# Patient Record
Sex: Female | Born: 1985
Health system: Southern US, Community
[De-identification: ages and names within clinical notes are randomized; demographics above are authoritative.]

## PROBLEM LIST (undated history)

## (undated) DIAGNOSIS — F32A Depression, unspecified: Secondary | ICD-10-CM

## (undated) DIAGNOSIS — F329 Major depressive disorder, single episode, unspecified: Secondary | ICD-10-CM

## (undated) DIAGNOSIS — M1990 Unspecified osteoarthritis, unspecified site: Secondary | ICD-10-CM

## (undated) HISTORY — DX: Major depressive disorder, single episode, unspecified: F32.9

## (undated) HISTORY — DX: Unspecified osteoarthritis, unspecified site: M19.90

## (undated) HISTORY — DX: Depression, unspecified: F32.A

---
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
## 2009-07-02 HISTORY — PX: OTHER SURGICAL HISTORY: SHX169

## 2009-08-28 ENCOUNTER — Emergency Department (HOSPITAL_COMMUNITY): Admission: EM | Admit: 2009-08-28 | Discharge: 2009-08-28 | Payer: Self-pay | Admitting: Emergency Medicine

## 2010-09-21 LAB — URINALYSIS, ROUTINE W REFLEX MICROSCOPIC
Bilirubin Urine: NEGATIVE
Ketones, ur: 15 mg/dL — AB
Specific Gravity, Urine: 1.024 (ref 1.005–1.030)
Urobilinogen, UA: 0.2 mg/dL (ref 0.0–1.0)
pH: 8.5 — ABNORMAL HIGH (ref 5.0–8.0)

## 2010-09-21 LAB — URINE MICROSCOPIC-ADD ON

## 2010-09-21 LAB — URINE CULTURE: Colony Count: NO GROWTH

## 2010-09-21 LAB — POCT I-STAT, CHEM 8
BUN: 11 mg/dL (ref 6–23)
Calcium, Ion: 1.12 mmol/L (ref 1.12–1.32)
Chloride: 108 mEq/L (ref 96–112)
Glucose, Bld: 95 mg/dL (ref 70–99)

## 2011-09-10 ENCOUNTER — Encounter: Payer: Self-pay | Admitting: Family Medicine

## 2011-09-10 ENCOUNTER — Ambulatory Visit (INDEPENDENT_AMBULATORY_CARE_PROVIDER_SITE_OTHER): Payer: BC Managed Care – PPO | Admitting: Family Medicine

## 2011-09-10 VITALS — BP 112/68 | HR 72 | Temp 98.0°F | Resp 12 | Ht 61.0 in | Wt 108.0 lb

## 2011-09-10 DIAGNOSIS — F419 Anxiety disorder, unspecified: Secondary | ICD-10-CM

## 2011-09-10 DIAGNOSIS — D239 Other benign neoplasm of skin, unspecified: Secondary | ICD-10-CM

## 2011-09-10 DIAGNOSIS — D229 Melanocytic nevi, unspecified: Secondary | ICD-10-CM

## 2011-09-10 DIAGNOSIS — R300 Dysuria: Secondary | ICD-10-CM

## 2011-09-10 DIAGNOSIS — F411 Generalized anxiety disorder: Secondary | ICD-10-CM

## 2011-09-10 DIAGNOSIS — Z8659 Personal history of other mental and behavioral disorders: Secondary | ICD-10-CM

## 2011-09-10 MED ORDER — CLONAZEPAM 0.5 MG PO TABS: 0.5000 mg | ORAL_TABLET | ORAL | Status: DC | PRN

## 2011-09-10 NOTE — Progress Notes (Signed)
  Subjective:    Patient ID: Sherry Mccoy, female    DOB: December 28, 1985, 26 y.o.   MRN: [id number]  HPI  New patient to establish care. She has history of intermittent anxiety which sounds like situational anxiety. She has taken clonazepam in the past as needed but takes rarely. She had some type of jaw problem last year which sound like TMJ. Past history depression which is currently stable off medication. She receives regular counseling.  Sees a gynecologist and has IUD.  Wisdom teeth extraction 2009 but no other surgeries. Occasional alcohol use. Nonsmoker. She is engaged to get married later this year.  She's had several moles and requests skin check to assess. Grandparent with some type of skin cancer but no known family history of melanoma.  Past Medical History  Diagnosis Date  . Arthritis     jaw  . Depression    Past Surgical History  Procedure Date  . Abd pain 2011    reports that she has never smoked. She does not have any smokeless tobacco history on file. Her alcohol and drug histories not on file. family history includes Alcohol abuse in her father and mother; Cancer in her maternal grandmother; and Diabetes in her paternal grandfather. No Known Allergies    Review of Systems  Constitutional: Negative for fever, activity change, appetite change, fatigue and unexpected weight change.  HENT: Negative for hearing loss, ear pain, sore throat and trouble swallowing.   Eyes: Negative for visual disturbance.  Respiratory: Negative for cough and shortness of breath.   Cardiovascular: Negative for chest pain and palpitations.  Gastrointestinal: Negative for abdominal pain, diarrhea, constipation and blood in stool.  Genitourinary: Negative for dysuria and hematuria.  Musculoskeletal: Negative for myalgias, back pain and arthralgias.  Skin: Negative for rash.  Neurological: Negative for dizziness, syncope and headaches.  Hematological: Negative for adenopathy.    Psychiatric/Behavioral: Negative for confusion and dysphoric mood.       Objective:   Physical Exam  Constitutional: She appears well-developed and well-nourished.  HENT:  Right Ear: External ear normal.  Left Ear: External ear normal.  Mouth/Throat: Oropharynx is clear and moist.  Neck: Neck supple. No thyromegaly present.  Cardiovascular: Normal rate and regular rhythm.   No murmur heard. Pulmonary/Chest: Effort normal and breath sounds normal. No respiratory distress. She has no wheezes. She has no rales.  Abdominal: Soft. She exhibits no mass. There is no tenderness.  Musculoskeletal: She exhibits no edema.  Lymphadenopathy:    She has no cervical adenopathy.  Skin:       Scattered nevi but none with significant atypical features.          Assessment & Plan:  #1 anxiety-likely situational by history.  Refilled clonazepam for prn use #2 Hx of depression stable. #3 benign nevi

## 2011-09-10 NOTE — Patient Instructions (Signed)
Watch for any changing moles and remember ABCD: A=asymmetry B=border irregularity C=color changes D=diameter > 6 mmm

## 2011-11-07 ENCOUNTER — Telehealth: Payer: Self-pay | Admitting: Family Medicine

## 2011-11-07 NOTE — Telephone Encounter (Signed)
This was a new pt on 09/10/1911

## 2011-11-07 NOTE — Telephone Encounter (Signed)
Refill OK

## 2011-11-07 NOTE — Telephone Encounter (Signed)
Pt never picked up script for clonazePAM (KLONOPIN) 0.5 MG tablet at pharmacy and now pharmacy says script has expired. Pt needs new script to be call in to [facility] on [address]. Pt would like to be notified when this has been called in to pharmacy.

## 2011-11-08 MED ORDER — CLONAZEPAM 0.5 MG PO TABS: 0.5000 mg | ORAL_TABLET | ORAL | Status: DC | PRN

## 2011-11-08 NOTE — Telephone Encounter (Signed)
Pt informed Rx called in on VM

## 2012-03-05 ENCOUNTER — Encounter: Payer: Self-pay | Admitting: Family Medicine

## 2012-03-05 ENCOUNTER — Ambulatory Visit (INDEPENDENT_AMBULATORY_CARE_PROVIDER_SITE_OTHER): Payer: BC Managed Care – PPO | Admitting: Family Medicine

## 2012-03-05 VITALS — BP 122/82 | Temp 98.2°F | Wt 112.5 lb

## 2012-03-05 DIAGNOSIS — H9190 Unspecified hearing loss, unspecified ear: Secondary | ICD-10-CM

## 2012-03-05 DIAGNOSIS — H9193 Unspecified hearing loss, bilateral: Secondary | ICD-10-CM

## 2012-03-05 NOTE — Progress Notes (Signed)
  Subjective:    Patient ID: Sherry Mccoy, female    DOB: 1985/11/17, 26 y.o.   MRN: [id number]  HPI  Patient describes possible bilateral hearing loss over the past 3-4 months. Denies vertigo. No recent nasal congestion. No ear drainage. No history of cerumen impaction. No headaches. No tinnitus. No history of loud noise exposure. Symptoms came on relatively gradual. No recent nausea or vomiting   Review of Systems  Constitutional: Negative for fever and chills.  HENT: Positive for hearing loss. Negative for ear pain, congestion and tinnitus.   Eyes: Negative for visual disturbance.  Neurological: Negative for dizziness and headaches.       Objective:   Physical Exam  Constitutional: She is oriented to person, place, and time. She appears well-developed and well-nourished.  HENT:  Right Ear: External ear normal.  Left Ear: External ear normal.  Mouth/Throat: Oropharynx is clear and moist.       Ear canals are patent. No cerumen. Eardrums appear normal. No evidence for effusion.  Neck: Neck supple.  Cardiovascular: Normal rate and regular rhythm.   Pulmonary/Chest: Effort normal and breath sounds normal.  Lymphadenopathy:    She has no cervical adenopathy.  Neurological: She is alert and oriented to person, place, and time. No cranial nerve deficit.       Weber and Rinne' screening tests are normal          Assessment & Plan:  Bilateral hearing loss subjectively. Check audiometry screen.  Audiometry  Reveals mild loss lower frequency range. No high frequency loss. No obvious source such as cerumen or effusion. ENT referral.

## 2012-03-13 ENCOUNTER — Encounter: Payer: Self-pay | Admitting: Family Medicine

## 2012-07-23 ENCOUNTER — Other Ambulatory Visit (INDEPENDENT_AMBULATORY_CARE_PROVIDER_SITE_OTHER): Payer: BC Managed Care – PPO

## 2012-07-23 DIAGNOSIS — Z0000 Encounter for general adult medical examination without abnormal findings: Secondary | ICD-10-CM

## 2012-07-23 LAB — LIPID PANEL
Cholesterol: 179 mg/dL (ref 0–200)
LDL Cholesterol: 112 mg/dL — ABNORMAL HIGH (ref 0–99)
Triglycerides: 61 mg/dL (ref 0.0–149.0)

## 2012-07-23 LAB — CBC WITH DIFFERENTIAL/PLATELET
Basophils Absolute: 0 10*3/uL (ref 0.0–0.1)
Eosinophils Absolute: 0.1 10*3/uL (ref 0.0–0.7)
Lymphocytes Relative: 41 % (ref 12.0–46.0)
MCHC: 33.9 g/dL (ref 30.0–36.0)
Neutro Abs: 1.7 10*3/uL (ref 1.4–7.7)
Neutrophils Relative %: 45.8 % (ref 43.0–77.0)
Platelets: 200 10*3/uL (ref 150.0–400.0)
RDW: 12.6 % (ref 11.5–14.6)

## 2012-07-23 LAB — BASIC METABOLIC PANEL
BUN: 12 mg/dL (ref 6–23)
CO2: 27 mEq/L (ref 19–32)
Calcium: 9.2 mg/dL (ref 8.4–10.5)
Creatinine, Ser: 0.7 mg/dL (ref 0.4–1.2)
Glucose, Bld: 82 mg/dL (ref 70–99)

## 2012-07-23 LAB — POCT URINALYSIS DIPSTICK
Glucose, UA: NEGATIVE
Nitrite, UA: NEGATIVE
Spec Grav, UA: 1.02
Urobilinogen, UA: 0.2

## 2012-07-23 LAB — HEPATIC FUNCTION PANEL
Albumin: 4.4 g/dL (ref 3.5–5.2)
Alkaline Phosphatase: 58 U/L (ref 39–117)
Bilirubin, Direct: 0.1 mg/dL (ref 0.0–0.3)

## 2012-07-30 ENCOUNTER — Encounter: Payer: Self-pay | Admitting: Family Medicine

## 2012-07-30 ENCOUNTER — Ambulatory Visit (INDEPENDENT_AMBULATORY_CARE_PROVIDER_SITE_OTHER): Payer: BC Managed Care – PPO | Admitting: Family Medicine

## 2012-07-30 VITALS — BP 110/70 | HR 72 | Temp 99.0°F | Resp 12 | Ht 62.0 in | Wt 112.0 lb

## 2012-07-30 DIAGNOSIS — Z0000 Encounter for general adult medical examination without abnormal findings: Secondary | ICD-10-CM

## 2012-07-30 NOTE — Progress Notes (Signed)
  Subjective:    Patient ID: Sherry Mccoy, female    DOB: May 15, 1986, 27 y.o.   MRN: [id number]  HPI  Patient seen for well visit. Generally very healthy. She sees gynecologist and has Mirena IUD. Nonsmoker. Exercises regularly. Tetanus up-to-date. No specific complaints. She has past history of depression which is currently stable off medication.  She has some pervasive fatigue for several years. Appetite and weight stable. Exercise regularly as above with no exercise intolerance. No dyspnea. No chest pains. Usually gets 10-11 hours sleep per night. No dietary changes.  Past Medical History  Diagnosis Date  . Arthritis     jaw  . Depression    Past Surgical History  Procedure Date  . Abd pain 2011    reports that she has never smoked. She does not have any smokeless tobacco history on file. Her alcohol and drug histories not on file. family history includes Alcohol abuse in her father and mother; Cancer in her maternal grandmother; and Diabetes in her paternal grandfather. No Known Allergies   Review of Systems  Constitutional: Negative for fever, activity change, appetite change, fatigue and unexpected weight change.  HENT: Negative for hearing loss, ear pain, sore throat and trouble swallowing.   Eyes: Negative for visual disturbance.  Respiratory: Negative for cough and shortness of breath.   Cardiovascular: Negative for chest pain and palpitations.  Gastrointestinal: Negative for abdominal pain, diarrhea, constipation and blood in stool.  Genitourinary: Negative for dysuria and hematuria.  Musculoskeletal: Negative for myalgias, back pain and arthralgias.  Skin: Negative for rash.  Neurological: Negative for dizziness, syncope and headaches.  Hematological: Negative for adenopathy.  Psychiatric/Behavioral: Negative for confusion and dysphoric mood.       Objective:   Physical Exam  Constitutional: She is oriented to person, place, and time. She appears well-developed  and well-nourished.  HENT:  Head: Normocephalic and atraumatic.  Eyes: EOM are normal. Pupils are equal, round, and reactive to light.  Neck: Normal range of motion. Neck supple. No thyromegaly present.  Cardiovascular: Normal rate, regular rhythm and normal heart sounds.   No murmur heard. Pulmonary/Chest: Breath sounds normal. No respiratory distress. She has no wheezes. She has no rales.  Abdominal: Soft. Bowel sounds are normal. She exhibits no distension and no mass. There is no tenderness. There is no rebound and no guarding.  Musculoskeletal: Normal range of motion. She exhibits no edema.  Lymphadenopathy:    She has no cervical adenopathy.  Neurological: She is alert and oriented to person, place, and time. She displays normal reflexes. No cranial nerve deficit.  Skin: No rash noted.  Psychiatric: She has a normal mood and affect. Her behavior is normal. Judgment and thought content normal.          Assessment & Plan:  Healthy 27 her old female. Labs reviewed with patient and no specific concerns. Continue regular exercise habits. She'll continue GYN followup.

## 2013-04-15 ENCOUNTER — Other Ambulatory Visit: Payer: Self-pay | Admitting: Family Medicine

## 2013-04-15 NOTE — Telephone Encounter (Signed)
Last visit 07/30/12 Last refill 11/08/11 #60 0 refill

## 2013-04-15 NOTE — Telephone Encounter (Signed)
Refill once 

## 2013-04-22 ENCOUNTER — Ambulatory Visit (INDEPENDENT_AMBULATORY_CARE_PROVIDER_SITE_OTHER): Payer: BC Managed Care – PPO | Admitting: Internal Medicine

## 2013-04-22 ENCOUNTER — Encounter: Payer: Self-pay | Admitting: Internal Medicine

## 2013-04-22 VITALS — BP 102/78 | HR 86 | Temp 98.4°F

## 2013-04-22 DIAGNOSIS — J029 Acute pharyngitis, unspecified: Secondary | ICD-10-CM

## 2013-04-22 DIAGNOSIS — F329 Major depressive disorder, single episode, unspecified: Secondary | ICD-10-CM

## 2013-04-22 DIAGNOSIS — F411 Generalized anxiety disorder: Secondary | ICD-10-CM

## 2013-04-22 DIAGNOSIS — F419 Anxiety disorder, unspecified: Secondary | ICD-10-CM

## 2013-04-22 MED ORDER — ESCITALOPRAM OXALATE 10 MG PO TABS: 10.0000 mg | ORAL_TABLET | Freq: Every day | ORAL | Status: DC

## 2013-04-22 MED ORDER — AZITHROMYCIN 250 MG PO TABS: ORAL_TABLET | ORAL | Status: DC

## 2013-04-22 NOTE — Progress Notes (Signed)
Subjective:    Patient ID: Sherry Mccoy, female    DOB: January 26, 1986, 27 y.o.   MRN: [id number]  Sore Throat  This is a new problem. The current episode started in the past 7 days. The problem has been gradually worsening. The pain is worse on the left side. The maximum temperature recorded prior to her arrival was 100 - 100.9 F. The pain is moderate. Associated symptoms include congestion and a hoarse voice. Pertinent negatives include no coughing, diarrhea, plugged ear sensation, neck pain, shortness of breath, swollen glands, trouble swallowing or vomiting. She has had no exposure to mono. Strep: ? She has tried cool liquids and gargles for the symptoms. The treatment provided mild relief.  Anxiety Presents for initial visit. Onset was 1 to 4 weeks ago. The problem has been waxing and waning. Symptoms include decreased concentration, depressed mood, irritability and nervous/anxious behavior. Patient reports no excessive worry, panic, shortness of breath or suicidal ideas. Symptoms occur most days. The severity of symptoms is moderate. Exacerbated by: social stressors - spoue has requested a "break" from her. The quality of sleep is poor.   Risk factors include marital problems. Her past medical history is significant for anxiety/panic attacks and depression. There is no history of suicide attempts. Past treatments include counseling (CBT) and benzodiazephines (previosuly on lexapro, but not in past 3 years). The treatment provided mild relief. Compliance with prior treatments has been good.   Past Medical History  Diagnosis Date  . Arthritis     jaw  . Depression     Review of Systems  Constitutional: Positive for irritability.  HENT: Positive for congestion and hoarse voice. Negative for trouble swallowing.   Respiratory: Negative for cough and shortness of breath.   Gastrointestinal: Negative for vomiting and diarrhea.  Musculoskeletal: Negative for neck pain.  Psychiatric/Behavioral:  Positive for decreased concentration. Negative for suicidal ideas. The patient is nervous/anxious.        Objective:   Physical Exam  BP 102/78  Pulse 86  Temp(Src) 98.4 F (36.9 C) (Oral)  SpO2 97% Wt Readings from Last 3 Encounters:  07/30/12 112 lb (50.803 kg)  03/05/12 112 lb 8 oz (51.03 kg)  09/10/11 108 lb (48.988 kg)   Constitutional: She appears well-developed and well-nourished. No distress.  HENT: Head: Normocephalic and atraumatic. Ears: B TMs ok, no erythema or effusion; Nose: Nose normal. Mouth/Throat: Oropharynx is erythematous, no patches of oropharyngeal exudate.  Eyes: Conjunctivae and EOM are normal. Pupils are equal, round, and reactive to light. No scleral icterus.  Neck: Normal range of motion. Neck supple. No JVD present. No thyromegaly present.  Cardiovascular: Normal rate, regular rhythm and normal heart sounds.  No murmur heard. No BLE edema. Pulmonary/Chest: Effort normal and breath sounds normal. No respiratory distress. She has no wheezes.  Psychiatric: She has a reserved, dysphoric and mildly anxious mood and affect. Her behavior is normal. Judgment and thought content normal.   Lab Results  Component Value Date   WBC 3.7* 07/23/2012   HGB 14.7 07/23/2012   HCT 43.3 07/23/2012   PLT 200.0 07/23/2012   GLUCOSE 82 07/23/2012   CHOL 179 07/23/2012   TRIG 61.0 07/23/2012   HDL 55.20 07/23/2012   LDLCALC 112* 07/23/2012   ALT 14 07/23/2012   AST 22 07/23/2012   NA 139 07/23/2012   K 4.4 07/23/2012   CL 105 07/23/2012   CREATININE 0.7 07/23/2012   BUN 12 07/23/2012   CO2 27 07/23/2012   TSH 2.03 07/23/2012  Assessment & Plan:   Acute pharyngitis - concern for strep by hx Empiric antibiotics and symptomatic care

## 2013-04-22 NOTE — Assessment & Plan Note (Signed)
History of same, currently situational precipitated by marital stress Previously on Lexapro, we'll resume same now In counseling, encouraged to increase frequency of visits Continue clonazepam as needed as ongoing Support offered we reviewed potential risk/benefit and possible side effects - pt understands and agrees to same  Patient to followup with primary care provider in 4-6 weeks for titration and review of symptoms/medication, patient agrees to call sooner if needed

## 2013-04-22 NOTE — Patient Instructions (Signed)
It was good to see you today.  Zpak antibiotics and continue Nyquil  Will also restart Lexapro as discussed  Your prescription(s) have been submitted to your pharmacy. Please take as directed and contact our office if you believe you are having problem(s) with the medication(s).  Alternate between ibuprofen and tylenol for aches, pain and fever symptoms as discussed  Hydrate, rest and call if worse or unimproved  follow up with Dr Caryl Never in 4-6 weeks to review depression symptoms and medications, call sooner if problems

## 2013-04-23 ENCOUNTER — Telehealth: Payer: Self-pay | Admitting: *Deleted

## 2013-04-23 MED ORDER — AMOXICILLIN 500 MG PO CAPS: 500.0000 mg | ORAL_CAPSULE | Freq: Three times a day (TID) | ORAL | Status: DC

## 2013-04-23 NOTE — Telephone Encounter (Signed)
Stop Z-Pak Begin amoxicillin instead -erx done

## 2013-04-23 NOTE — Telephone Encounter (Signed)
Spoke with pt advised of MDs message 

## 2013-04-23 NOTE — Telephone Encounter (Signed)
Pt called states the Z-Pac caused nausea, vomiting, diarrhea.  Please advise

## 2013-06-16 ENCOUNTER — Encounter: Payer: Self-pay | Admitting: *Deleted

## 2013-06-17 ENCOUNTER — Encounter: Payer: Self-pay | Admitting: Family Medicine

## 2013-06-17 ENCOUNTER — Ambulatory Visit (INDEPENDENT_AMBULATORY_CARE_PROVIDER_SITE_OTHER): Payer: BC Managed Care – PPO | Admitting: Family Medicine

## 2013-06-17 VITALS — BP 108/80 | HR 89 | Temp 98.9°F | Wt 103.0 lb

## 2013-06-17 DIAGNOSIS — Z7184 Encounter for health counseling related to travel: Secondary | ICD-10-CM

## 2013-06-17 DIAGNOSIS — F411 Generalized anxiety disorder: Secondary | ICD-10-CM

## 2013-06-17 DIAGNOSIS — Z7189 Other specified counseling: Secondary | ICD-10-CM

## 2013-06-17 DIAGNOSIS — F419 Anxiety disorder, unspecified: Secondary | ICD-10-CM

## 2013-06-17 DIAGNOSIS — Z8659 Personal history of other mental and behavioral disorders: Secondary | ICD-10-CM

## 2013-06-17 MED ORDER — ESCITALOPRAM OXALATE 10 MG PO TABS: 10.0000 mg | ORAL_TABLET | Freq: Every day | ORAL | Status: DC

## 2013-06-17 NOTE — Progress Notes (Signed)
Pre visit review using our clinic review tool, if applicable. No additional management support is needed unless otherwise documented below in the visit note. 

## 2013-06-17 NOTE — Progress Notes (Signed)
   Subjective:    Patient ID: Sherry Mccoy, female    DOB: 08-03-1985, 27 y.o.   MRN: [id number]  HPI  Patient seen regarding recent stress and anxiety issues. She was placed back on Lexapro recently following some marital difficulties. Her husband and she are back together they're trying to work things out counseling. Patient feels that she is much improved on Lexapro 10 mg daily. Less anxiety. Mood has stabilized. She takes: Nasal time but very rarely for insomnia or severe anxiety symptoms.   She has upcoming travel to [place] in March. She has never had hepatitis B vaccine. She will need typhoid vaccination and malaria prevention. Also apparently will need yellow fever vaccination  Past Medical History  Diagnosis Date  . Arthritis     jaw  . Depression    Past Surgical History  Procedure Laterality Date  . Abd pain  2011    reports that she has never smoked. She does not have any smokeless tobacco history on file. Her alcohol and drug histories are not on file. family history includes Alcohol abuse in her father and mother; Cancer in her maternal grandmother; Diabetes in her paternal grandfather. No Known Allergies   Review of Systems  Constitutional: Negative for appetite change and unexpected weight change.  Psychiatric/Behavioral: Negative for dysphoric mood, decreased concentration and agitation. The patient is not nervous/anxious.        Objective:   Physical Exam  Constitutional: She appears well-developed and well-nourished.  Cardiovascular: Normal rate.   Pulmonary/Chest: Breath sounds normal. No respiratory distress. She has no wheezes. She has no rales.  Psychiatric: She has a normal mood and affect. Her behavior is normal.          Assessment & Plan:  #1 travel medicine consultation. Recommend she get yellow fever vaccine and will set up through health department. She will come back for physical in January or February. Addressed hepatitis A and typhoid  vaccination all with malaria prevention that time #2 situational stress/anxiety. Continue counseling. Continue Lexapro 10 mg daily with refills given

## 2013-06-23 ENCOUNTER — Other Ambulatory Visit: Payer: Self-pay | Admitting: Internal Medicine

## 2013-06-23 NOTE — Telephone Encounter (Signed)
Refill for 6 months. 

## 2013-07-30 ENCOUNTER — Other Ambulatory Visit (INDEPENDENT_AMBULATORY_CARE_PROVIDER_SITE_OTHER): Payer: BC Managed Care – PPO

## 2013-07-30 DIAGNOSIS — Z0000 Encounter for general adult medical examination without abnormal findings: Secondary | ICD-10-CM

## 2013-07-30 LAB — CBC WITH DIFFERENTIAL/PLATELET
BASOS ABS: 0 10*3/uL (ref 0.0–0.1)
BASOS PCT: 0.3 % (ref 0.0–3.0)
EOS PCT: 1.4 % (ref 0.0–5.0)
Eosinophils Absolute: 0.1 10*3/uL (ref 0.0–0.7)
HEMATOCRIT: 40.7 % (ref 36.0–46.0)
HEMOGLOBIN: 13.6 g/dL (ref 12.0–15.0)
LYMPHS ABS: 1.2 10*3/uL (ref 0.7–4.0)
LYMPHS PCT: 31.7 % (ref 12.0–46.0)
MCHC: 33.5 g/dL (ref 30.0–36.0)
MCV: 95.7 fl (ref 78.0–100.0)
MONOS PCT: 9.9 % (ref 3.0–12.0)
Monocytes Absolute: 0.4 10*3/uL (ref 0.1–1.0)
NEUTROS ABS: 2.1 10*3/uL (ref 1.4–7.7)
Neutrophils Relative %: 56.7 % (ref 43.0–77.0)
Platelets: 188 10*3/uL (ref 150.0–400.0)
RBC: 4.25 Mil/uL (ref 3.87–5.11)
RDW: 12.7 % (ref 11.5–14.6)
WBC: 3.7 10*3/uL — AB (ref 4.5–10.5)

## 2013-07-30 LAB — LIPID PANEL
CHOL/HDL RATIO: 2
Cholesterol: 153 mg/dL (ref 0–200)
HDL: 69.2 mg/dL (ref >39.00)
LDL CALC: 77 mg/dL (ref 0–99)
Triglycerides: 35 mg/dL (ref 0.0–149.0)
VLDL: 7 mg/dL (ref 0.0–40.0)

## 2013-07-30 LAB — POCT URINALYSIS DIPSTICK
Bilirubin, UA: NEGATIVE
Blood, UA: NEGATIVE
Glucose, UA: NEGATIVE
KETONES UA: NEGATIVE
LEUKOCYTES UA: NEGATIVE
Nitrite, UA: NEGATIVE
PH UA: 5.5
PROTEIN UA: NEGATIVE
Spec Grav, UA: 1.025
UROBILINOGEN UA: 0.2

## 2013-07-30 LAB — BASIC METABOLIC PANEL
BUN: 12 mg/dL (ref 6–23)
CALCIUM: 9.1 mg/dL (ref 8.4–10.5)
CO2: 25 mEq/L (ref 19–32)
Chloride: 107 mEq/L (ref 96–112)
Creatinine, Ser: 0.7 mg/dL (ref 0.4–1.2)
GFR: 105.07 mL/min (ref >60.00)
Glucose, Bld: 78 mg/dL (ref 70–99)
POTASSIUM: 4.2 meq/L (ref 3.5–5.1)
SODIUM: 139 meq/L (ref 135–145)

## 2013-07-30 LAB — HEPATIC FUNCTION PANEL
ALBUMIN: 4.1 g/dL (ref 3.5–5.2)
ALK PHOS: 61 U/L (ref 39–117)
ALT: 18 U/L (ref 0–35)
AST: 24 U/L (ref 0–37)
Bilirubin, Direct: 0 mg/dL (ref 0.0–0.3)
TOTAL PROTEIN: 6.8 g/dL (ref 6.0–8.3)
Total Bilirubin: 0.7 mg/dL (ref 0.3–1.2)

## 2013-07-30 LAB — TSH: TSH: 1.81 u[IU]/mL (ref 0.35–5.50)

## 2013-08-05 ENCOUNTER — Encounter: Payer: Self-pay | Admitting: Family Medicine

## 2013-08-05 ENCOUNTER — Ambulatory Visit (INDEPENDENT_AMBULATORY_CARE_PROVIDER_SITE_OTHER): Payer: BC Managed Care – PPO | Admitting: Family Medicine

## 2013-08-05 VITALS — BP 110/70 | HR 63 | Temp 98.3°F | Ht 62.0 in | Wt 105.0 lb

## 2013-08-05 DIAGNOSIS — Z0000 Encounter for general adult medical examination without abnormal findings: Secondary | ICD-10-CM

## 2013-08-05 DIAGNOSIS — Z23 Encounter for immunization: Secondary | ICD-10-CM

## 2013-08-05 DIAGNOSIS — Z7184 Encounter for health counseling related to travel: Secondary | ICD-10-CM

## 2013-08-05 DIAGNOSIS — Z7189 Other specified counseling: Secondary | ICD-10-CM

## 2013-08-05 MED ORDER — ATOVAQUONE-PROGUANIL HCL 250-100 MG PO TABS: ORAL_TABLET | ORAL | Status: DC

## 2013-08-05 MED ORDER — TYPHOID VACCINE PO CPDR: 1.0000 | DELAYED_RELEASE_CAPSULE | ORAL | Status: DC

## 2013-08-05 MED ORDER — CIPROFLOXACIN HCL 500 MG PO TABS: ORAL_TABLET | ORAL | Status: DC

## 2013-08-05 MED ORDER — TEMAZEPAM 15 MG PO CAPS
15.0000 mg | ORAL_CAPSULE | Freq: Every evening | ORAL | Status: DC | PRN
Start: 2013-08-05 — End: 2014-08-11

## 2013-08-05 NOTE — Progress Notes (Signed)
   Subjective:    Patient ID: Sherry Mccoy, female    DOB: 09/28/1985, 28 y.o.   MRN: [id number]  HPI Here for complete physical She sees gynecologist regularly and has IUD in place She's had some recent depression issues which are stable on Lexapro. She's had some marital issues her past year but has received counseling and things are improved at this time.  Upcoming travel to [place]. No history of hepatitis A. She also needs malaria and typhoid prevention. She plans to get yellow fever vaccine through health Department.  Past Medical History  Diagnosis Date  . Arthritis     jaw  . Depression    Past Surgical History  Procedure Laterality Date  . Abd pain  2011    reports that she has never smoked. She does not have any smokeless tobacco history on file. Her alcohol and drug histories are not on file. family history includes Alcohol abuse in her mother; Cancer in her maternal grandmother; Diabetes in her paternal grandfather. No Known Allergies    Review of Systems  Constitutional: Negative for fever, activity change, appetite change, fatigue and unexpected weight change.  HENT: Negative for ear pain, hearing loss, sore throat and trouble swallowing.   Eyes: Negative for visual disturbance.  Respiratory: Negative for cough and shortness of breath.   Cardiovascular: Negative for chest pain and palpitations.  Gastrointestinal: Negative for abdominal pain, diarrhea, constipation and blood in stool.  Endocrine: Negative for polydipsia and polyuria.  Genitourinary: Negative for dysuria and hematuria.  Musculoskeletal: Negative for arthralgias, back pain and myalgias.  Skin: Negative for rash.  Neurological: Negative for dizziness, syncope and headaches.  Hematological: Negative for adenopathy.  Psychiatric/Behavioral: Negative for confusion and dysphoric mood.       Objective:   Physical Exam  Constitutional: She is oriented to person, place, and time. She appears  well-developed and well-nourished.  HENT:  Head: Normocephalic and atraumatic.  Eyes: EOM are normal. Pupils are equal, round, and reactive to light.  Neck: Normal range of motion. Neck supple. No thyromegaly present.  Cardiovascular: Normal rate, regular rhythm and normal heart sounds.   No murmur heard. Pulmonary/Chest: Breath sounds normal. No respiratory distress. She has no wheezes. She has no rales.  Abdominal: Soft. Bowel sounds are normal. She exhibits no distension and no mass. There is no tenderness. There is no rebound and no guarding.  Genitourinary:  Per GYN  Musculoskeletal: Normal range of motion. She exhibits no edema.  Lymphadenopathy:    She has no cervical adenopathy.  Neurological: She is alert and oriented to person, place, and time. She displays normal reflexes. No cranial nerve deficit.  Skin: No rash noted.  Psychiatric: She has a normal mood and affect. Her behavior is normal. Judgment and thought content normal.          Assessment & Plan:  #1 complete physical. Labs reviewed and all excellent. She'll continue GYN followup. Tetanus booster given next year. #2 travel medicine consultation. Hepatitis A vaccine given. Malarone 250 mg for malaria prevention. Typhoid oral vaccine given. Cipro as needed for traveler's diarrhea. Short-term only use of temazepam as needed for sleep

## 2013-08-05 NOTE — Progress Notes (Signed)
Pre visit review using our clinic review tool, if applicable. No additional management support is needed unless otherwise documented below in the visit note. 

## 2013-08-05 NOTE — Patient Instructions (Signed)
Traveling, Food and Drink Risks Unclean or not pure (contaminated) food and drink are common sources for bringing infection into the body, especially the digestive system (gastroenteritis). This can cause nausea, stomach cramping, diarrhea (with or without visible blood) and/or vomiting. Some of the common infections that travelers can get are:  Escherichia coli infections (E. coli).  Shigellosis or bacillary dysentery.  Giardiasis.  Campylobacter jejuni infections.  Cryptosporidiosis.  Hepatitis A.  Amebiasis. Other less common infectious disease risks for travelers include:  Typhoid fever and other salmonelloses.  Cholera.  Viral infections caused by rotavirus and noroviruses.  Various protozoan and helminthic (worm-like) parasites. Many of the infectious diseases transmitted in food and water can also be acquired when solid body wastes, or feces, contaminate food (fecal-oral route). WHAT IS TRAVELERS' DIARRHEA? The most common travel health problem is travelers' diarrhea. Between 20% and 50% of international travelers develop diarrhea each year. It often occurs in the first week of travel. However, it may occur at any time while traveling, or after returning home. The world is divided into three grades of risk:  Low-risk: [place], [place], [place], [place], [place], countries in [place] and [place].  Intermediate-risk: [place], [place], some of the [place].  High-risk: Most of [place], [place], [place], [place], [place] and [place]. In high risk places, often many people do not have access to plumbing or outhouses. The amount of contaminated stool is high and more open to flies. Poor electricity capacity can cause blackouts. This affects refrigeration, which may cause unsafe food storage. Lack of water supplies may mean a lack of sinks for hand washing by restaurant staff. People at greater risk include young adults, and those with low  immune response. This includes people with HIV/AIDS, or those taking medicines to decrease immunity. Also, people with inflammatory-bowel disease or diabetes, and people taking stomach medicines that reduce acid level (H-2 blockers or antacids). The leading cause of infection is consuming food or water contaminated with feces. Poor hygiene practice in local restaurants is thought to be the largest cause of travelers' diarrhea. The bacteria or germ that most often causes this illness is E. coli. WHAT ARE THE SYMPTOMS OF TRAVELERS' DIARRHEA? The illness often begins suddenly. It causes an increase in frequency, volume, and weight of stool. An affected person often has 4 to 5 loose, watery bowel movements each day. Other symptoms include nausea, vomiting, diarrhea, stomach cramping, bloating, fever, urgency, and general ill feeling (malaise). Most cases are not serious and go away in 1-2 days without treatment. Travelers' diarrhea is rarely life-threatening. (90% of cases resolve in 1 week. 98% resolve in 1 month.) PREVENTING TRAVELERS' DIARRHEA Reduce your exposure to potentially not pure water. Water that is chlorinated, using minimum water treatment standards used in the [place] protects against viral and bacterial (germs) diseases. Chlorine treatment alone might not kill some enteric (gut) viruses. It may not kill all infection causing parasites. Where chlorinated tap water is not available, or where hygiene and sanitation are poor, only the following might be safe to drink:   Beverages made with boiled water (tea, coffee).  Canned or bottled carbonated drinks (carbonated bottled water, soft drinks).  Beer.  Wine. When buying carbonated drinks or bottled water, always inspect the bottle seal. Make sure it has not been opened. This could mean it was refilled with unclean beverages. If you suspect a bottle seal has been tampered with, return or discard it.  Where water might be contaminated, ice could  be, also. Ice should not be used in beverages. If ice comes in contact with containers used for drinking, discard the ice. Thoroughly clean the containers, preferably with soap and hot water.  It is safer to drink a beverage directly from the can or bottle than from a questionable container. However, water on the outside of cans or bottles might not be pure. Dry wet cans or bottles before they are opened. Wipe clean the surfaces where your mouth will have contact. Also, avoid brushing your teeth with tap water.  PREVENTIVE TREATMENT OF WATER  The following methods can be used to treat water. This makes it safe for drinking and other purposes.   Boiling. This is the best method. Bring water to a rolling boil for 1 minute. Then allow it to cool to room temperature. Ice should not be added. Boiling will kill bacterial and parasitic causes of diarrhea at all altitudes. It will kill viruses at low altitudes. To kill viruses at altitudes above 2,000 meters (6,562 feet), water should be boiled for 3 minutes. Or chemical disinfection should be used after the water has boiled for 1 minute. To improve taste, add a pinch of salt to each quart. Or pour the water several times from one clean container to another.  Chemical disinfection. Iodine can be used, when boiling is not possible. However, this method might not kill all parasites, unless the water sits for 15 hours before it is drunk. Two well-tested methods for disinfection with iodine are the use of:  Tincture of iodine.  Tetraglycine hydroperiodide tablets. Examples are Globaline, Potable-Aqua, or Coghlan's. You can find these tablets at pharmacies and sporting goods stores. Follow the instructions on the label. If water is cloudy, double the number of tablets used. If water is extremely cold (less than 5 Celsius or 41 Fahrenheit), try to warm it. Increase the advised contact time to achieve reliable disinfection. Cloudy water should be strained through  a clean cloth into a container. This should remove floating matter. Then the water should be boiled. Or it can be treated with iodine. Chlorine, in various forms, can also be used for chemical disinfection. But its germ killing activity varies greatly with the acidity (pH), temperature, and content of the water. Chemically treated water is meant for short-term use only. Use iodine disinfected water for only a few weeks.   Portable filters provide various degrees of protection. Reverse-osmosis filters protect against viruses, bacteria (germs), and protozoa. However, they are expensive and large. The small pores on this type of filter get quickly plugged by muddy or cloudy water. The membranes in some filters can be damaged by chlorine. Microstrainer filters (0.1- to 0.3-micrometers), can remove bacteria and protozoa. But they do not remove viruses. To kill viruses, travelers should disinfect the water with iodine or chlorine after filtration. Filters with iodine-impregnated resins work best against bacteria. The iodine will kill some viruses. But the contact time with the iodine in the filter is too short to kill some parasites. To produce safe water, proper selection, operation, care, and maintenance of water filters is needed. Follow the instructions on the label.  As a last resort, if safe drinking water is not available, very hot tap water might be safer than cold tap water. But proper disinfection, filtering, or boiling is still advised. REDUCING RISK FROM FOOD  Reduce your exposure to potentially contaminated food. To avoid illness, travelers should select food with care. All raw food could be contaminated. Especially where hygiene and sanitation are  poor, avoid:  Salads.  Uncooked vegetables.  Unpeeled fruits or vegetables.  Unpasteurized milk and milk products, such as cheese.  Undercooked and raw meat, fish, and shellfish.  Eat only food that has been cooked and is still hot.  Eat fruit  that has been prepared by a food service provider who routinely caters to foreign travelers.  Cooked food that stands for hours at room temperature can have bacterial growth. It should be thoroughly reheated before serving. Avoid foods that may have been sitting for some time in the kitchen or in a buffet.  Do not eat food purchased from street vendors. Consuming food and beverages purchased from street vendors has been linked to increased risk of illness.  Eat at restaurants that often cater to foreign travelers (leading hotels, hotel chains).  To guarantee safe food for an infant younger than 47 months of age, breastfeed. If the infant has already been weaned from the breast, formula prepared from commercial powder and boiled water is the safest food.  Carry small containers of hand-sanitizing solutions or gels (with at least 60% alcohol). This makes it easier to clean your hands before eating.  Some fish and shellfish contain poisonous biotoxins (such as ciguatoxin), even when cooked. Marya Landry is the most toxin filled. It should always be avoided. Red snapper, grouper, amber jack, sea bass, and other tropical reef fish contain the toxin at various times. Poisoning potential exists in all areas where those fish are eaten. Especially in [place] areas of the [place], [place], and [place]. Symptoms of this poisoning include gastroenteritis. That is followed by:  Neurologic problems, such as dysesthesias (impaired senses, especially touch).  Temperature reversal.  Weakness.  Rarely, low blood pressure (hypotension).  Scombroid is another common fish poisoning. It occurs worldwide in tropical and temperate regions. Fish of the [place] family (bluefin, yellow fin tuna, mackerel, bonito), and some non-scombroid fish (mahi [place], herring, amber [place], [place]) may contain high levels of histidine. With poor refrigeration or preservation, histidine turns into  histamine. This can cause:  Flushing (becoming red faced).  Headache.  Feeling sick to your stomach (nausea).  Vomiting.  Diarrhea.  Hives (urticaria). Cholera has occurred in people who ate crab brought back from [place]. Travelers should not bring perishable seafood with them when they return to the [place]' DIARRHEA  This illness often goes away without treatment. Oral rehydration (giving the body safe water and added packets of salt and sugar mixtures) can replace lost fluids and chemicals in the blood (electrolytes). This is especially important in children and people with longstanding (chronic) diseases. For severe fluid loss, the best replacement is with oral rehydration solutions (ORS), such as the WHO ORS solutions. These are widely available at stores and pharmacies in most developing countries.  When symptoms first occur, stop eating, and only drink clear liquids (only for adults) to help quiet the stomach. Travelers who develop 3 or more loose stools in an 8-hour period may benefit from antibiotic medicine. Especially if you also have nausea, vomiting, stomach cramps, fever, or blood in stools. Antibiotics are often given for 3-5 days. Some caregivers will prescribe antibiotics for people who are traveling to high risk places, to be used if and when symptoms begin. If diarrhea continues despite treatment, travelers should be evaluated by a caregiver and treated for possible parasitic infection. Control of frequent diarrhea is possible with over-the-counter medicines called anti-motility agents. These drugs reduce the amount of diarrhea by slowing  transit time in the stomach. This allows more time for food and water to be absorbed. It is thought that diarrhea may be a defense mechanism the body uses, to reduce the contact time between infection causing germs and the stomach. Anti-motility drugs decrease the duration of diarrhea. But they should never be used by  travelers with fever or bloody diarrhea. Such drugs can increase the severity of disease by slowing the removal of germs from the body. If excessively or not properly taken, anti-motility agents can cause serious illness on their own. Studies have found that people who follow the above eating rules still get ill sometimes. Antibiotic prevention before infection occurs is not advised for most travelers. Exceptions include those who have a high risk of serious health problems if treated after infection. However, studies have shown that the active ingredient in Pepto-Bismol (bismuth subsalicylate, BSS), taken regularly while traveling, can offer some protection against travelers' diarrhea. Ask your caregiver about this option before traveling, if desired. Do not use in children. This Information Courtesy of CDC. Document Released: 09/08/2002 Document Revised: 09/10/2011 Document Reviewed: 04/19/2009 [facility] Patient Information 2014 [place].

## 2014-01-22 ENCOUNTER — Ambulatory Visit (INDEPENDENT_AMBULATORY_CARE_PROVIDER_SITE_OTHER): Payer: BC Managed Care – PPO | Admitting: Family Medicine

## 2014-01-22 ENCOUNTER — Encounter: Payer: Self-pay | Admitting: Family Medicine

## 2014-01-22 VITALS — BP 110/68 | HR 71 | Temp 98.4°F | Wt 103.0 lb

## 2014-01-22 DIAGNOSIS — J029 Acute pharyngitis, unspecified: Secondary | ICD-10-CM

## 2014-01-22 LAB — POCT RAPID STREP A (OFFICE): RAPID STREP A SCREEN: NEGATIVE

## 2014-01-22 MED ORDER — AMOXICILLIN 875 MG PO TABS: 875.0000 mg | ORAL_TABLET | Freq: Two times a day (BID) | ORAL | Status: AC

## 2014-01-22 NOTE — Progress Notes (Signed)
   Subjective:    Patient ID: Sherry Mccoy, female    DOB: 03-19-1986, 28 y.o.   MRN: [id number]  Otalgia  Associated symptoms include a sore throat. Pertinent negatives include no coughing.   Acute visit. Sore throat and left ear ache. Onset 2 days ago. She's had some chills and sweats and suspected fever, though not taken. She has some body aches and intermittent headaches. No cough or nasal congestion. No sick contacts. No skin rash  Past Medical History  Diagnosis Date  . Arthritis     jaw  . Depression    Past Surgical History  Procedure Laterality Date  . Abd pain  2011    reports that she has never smoked. She does not have any smokeless tobacco history on file. Her alcohol and drug histories are not on file. family history includes Alcohol abuse in her mother; Cancer in her maternal grandmother; Diabetes in her paternal grandfather. No Known Allergies    Review of Systems  Constitutional: Positive for fever, chills and fatigue.  HENT: Positive for ear pain and sore throat. Negative for congestion and voice change.   Respiratory: Negative for cough.   Hematological: Positive for adenopathy.       Objective:   Physical Exam  Constitutional: She appears well-developed and well-nourished.  HENT:  Right Ear: External ear normal.  Left Ear: External ear normal.  Posterior pharynx erythema. No exudate. Erythema is greater left side than right. No soft palate asymmetry. No evidence for peritonsillar abscess  Neck: Neck supple.  Tender anterior cervical nodes bilaterally  Cardiovascular: Normal rate and regular rhythm.   Pulmonary/Chest: Effort normal and breath sounds normal. No respiratory distress. She has no wheezes. She has no rales.  Lymphadenopathy:    She has cervical adenopathy.  Skin: No rash noted.          Assessment & Plan:  Acute pharyngitis. Rule out strep. Check rapid strep.  Rapid strep negative. Clinical suspicion for strep still present (lack  of cough/nasal, increased nodes, appearance of pharynx, etc).  Discussed possible throat cx.  Start Amoxicillin 875 mg bid for  10 days.

## 2014-01-22 NOTE — Patient Instructions (Signed)

## 2014-01-22 NOTE — Progress Notes (Signed)
Pre visit review using our clinic review tool, if applicable. No additional management support is needed unless otherwise documented below in the visit note. 

## 2014-02-25 ENCOUNTER — Telehealth: Payer: Self-pay | Admitting: Family Medicine

## 2014-02-25 NOTE — Telephone Encounter (Signed)
Noted  

## 2014-02-25 NOTE — Telephone Encounter (Signed)
Patient Information:  Caller Name: Deema  Phone: [phone]  Patient: Sherry Mccoy, Sherry Mccoy  Gender: Female  DOB: 09-20-1985  Age: 28 Years  PCP: Carolann Littler [facility])  Pregnant: No  Office Follow Up:  Does the office need to follow up with this patient?: No  Instructions For The Office: N/A   Symptoms  Reason For Call & Symptoms: Pt reports 2 cranker sore on the upper lip.  Reviewed Health History In EMR: Yes  Reviewed Medications In EMR: Yes  Reviewed Allergies In EMR: Yes  Reviewed Surgeries / Procedures: Yes  Date of Onset of Symptoms: 02/21/2014 OB / GYN:  LMP: 02/21/2014  Guideline(s) Used:  Cold Sores - Fever Blisters of Lip  Disposition Per Guideline:   See Today or Tomorrow in Office  Reason For Disposition Reached:   Patient wants to be seen  Advice Given:  Call Back If:  You become worse  Patient Will Follow Care Advice:  YES  Appointment Scheduled:  02/26/2014 08:45:00 Appointment Scheduled Provider:  Maudie Mercury (TEXT 1st, after 20 mins can call), [facility])

## 2014-02-26 ENCOUNTER — Encounter: Payer: Self-pay | Admitting: Family Medicine

## 2014-02-26 NOTE — Progress Notes (Signed)
Error   This encounter was created in error - please disregard. 

## 2014-03-31 ENCOUNTER — Other Ambulatory Visit: Payer: Self-pay | Admitting: Family Medicine

## 2014-03-31 NOTE — Telephone Encounter (Signed)
Last visit 01/22/14 Last refill 08/05/13 #[phone] 0 refill

## 2014-03-31 NOTE — Telephone Encounter (Signed)
Refill once.  Avoid regular use. 

## 2014-08-06 ENCOUNTER — Other Ambulatory Visit (INDEPENDENT_AMBULATORY_CARE_PROVIDER_SITE_OTHER): Payer: BLUE CROSS/BLUE SHIELD

## 2014-08-06 DIAGNOSIS — Z0000 Encounter for general adult medical examination without abnormal findings: Secondary | ICD-10-CM

## 2014-08-06 LAB — CBC WITH DIFFERENTIAL/PLATELET
Basophils Absolute: 0 10*3/uL (ref 0.0–0.1)
Basophils Relative: 0.3 % (ref 0.0–3.0)
Eosinophils Absolute: 0.1 10*3/uL (ref 0.0–0.7)
Eosinophils Relative: 1.8 % (ref 0.0–5.0)
HCT: 40.4 % (ref 36.0–46.0)
HEMOGLOBIN: 14.1 g/dL (ref 12.0–15.0)
LYMPHS ABS: 1.2 10*3/uL (ref 0.7–4.0)
Lymphocytes Relative: 36.7 % (ref 12.0–46.0)
MCHC: 34.8 g/dL (ref 30.0–36.0)
MCV: 90.9 fl (ref 78.0–100.0)
Monocytes Absolute: 0.3 10*3/uL (ref 0.1–1.0)
Monocytes Relative: 9.5 % (ref 3.0–12.0)
Neutro Abs: 1.7 10*3/uL (ref 1.4–7.7)
Neutrophils Relative %: 51.7 % (ref 43.0–77.0)
PLATELETS: 222 10*3/uL (ref 150.0–400.0)
RBC: 4.44 Mil/uL (ref 3.87–5.11)
RDW: 12.3 % (ref 11.5–15.5)
WBC: 3.3 10*3/uL — AB (ref 4.0–10.5)

## 2014-08-06 LAB — HEPATIC FUNCTION PANEL
ALK PHOS: 56 U/L (ref 39–117)
ALT: 12 U/L (ref 0–35)
AST: 19 U/L (ref 0–37)
Albumin: 4.3 g/dL (ref 3.5–5.2)
BILIRUBIN DIRECT: 0.1 mg/dL (ref 0.0–0.3)
TOTAL PROTEIN: 7.2 g/dL (ref 6.0–8.3)
Total Bilirubin: 0.5 mg/dL (ref 0.2–1.2)

## 2014-08-06 LAB — LIPID PANEL
CHOL/HDL RATIO: 2
Cholesterol: 157 mg/dL (ref 0–200)
HDL: 66.7 mg/dL (ref >39.00)
LDL CALC: 80 mg/dL (ref 0–99)
NonHDL: 90.3
TRIGLYCERIDES: 54 mg/dL (ref 0.0–149.0)
VLDL: 10.8 mg/dL (ref 0.0–40.0)

## 2014-08-06 LAB — BASIC METABOLIC PANEL
BUN: 10 mg/dL (ref 6–23)
CALCIUM: 9.1 mg/dL (ref 8.4–10.5)
CO2: 28 mEq/L (ref 19–32)
Chloride: 106 mEq/L (ref 96–112)
Creatinine, Ser: 0.81 mg/dL (ref 0.40–1.20)
GFR: 89.57 mL/min (ref >60.00)
Glucose, Bld: 81 mg/dL (ref 70–99)
POTASSIUM: 4.9 meq/L (ref 3.5–5.1)
SODIUM: 139 meq/L (ref 135–145)

## 2014-08-06 LAB — TSH: TSH: 2.87 u[IU]/mL (ref 0.35–4.50)

## 2014-08-11 ENCOUNTER — Ambulatory Visit (INDEPENDENT_AMBULATORY_CARE_PROVIDER_SITE_OTHER): Payer: BLUE CROSS/BLUE SHIELD | Admitting: Family Medicine

## 2014-08-11 ENCOUNTER — Encounter: Payer: Self-pay | Admitting: Family Medicine

## 2014-08-11 VITALS — BP 100/70 | HR 72 | Temp 97.9°F | Ht 62.0 in | Wt 104.0 lb

## 2014-08-11 DIAGNOSIS — Z0000 Encounter for general adult medical examination without abnormal findings: Secondary | ICD-10-CM

## 2014-08-11 DIAGNOSIS — Z23 Encounter for immunization: Secondary | ICD-10-CM

## 2014-08-11 DIAGNOSIS — D239 Other benign neoplasm of skin, unspecified: Secondary | ICD-10-CM

## 2014-08-11 DIAGNOSIS — D229 Melanocytic nevi, unspecified: Secondary | ICD-10-CM

## 2014-08-11 NOTE — Progress Notes (Signed)
Pre visit review using our clinic review tool, if applicable. No additional management support is needed unless otherwise documented below in the visit note. 

## 2014-08-11 NOTE — Progress Notes (Signed)
   Subjective:    Patient ID: Sherry Mccoy, female    DOB: August 19, 1985, 29 y.o.   MRN: [id number]  HPI Patient seen for complete physical. She sees gynecologist and has IUD. She has previously had history of depression but is currently stable off medications. Last tetanus 10 years ago. She declines flu vaccine. She is getting Pap smears through GYN office. Generally she feels well. Nonsmoker. No consistent exercise.  Past Medical History  Diagnosis Date  . Arthritis     jaw  . Depression    Past Surgical History  Procedure Laterality Date  . Abd pain  2011    reports that she has never smoked. She does not have any smokeless tobacco history on file. Her alcohol and drug histories are not on file. family history includes Alcohol abuse in her mother; Cancer in her maternal grandmother; Diabetes in her paternal grandfather. No Known Allergies    Review of Systems  Constitutional: Negative for fever, activity change, appetite change, fatigue and unexpected weight change.  HENT: Negative for ear pain, hearing loss, sore throat and trouble swallowing.   Eyes: Negative for visual disturbance.  Respiratory: Negative for cough and shortness of breath.   Cardiovascular: Negative for chest pain and palpitations.  Gastrointestinal: Negative for abdominal pain, diarrhea, constipation and blood in stool.  Genitourinary: Negative for dysuria and hematuria.  Musculoskeletal: Negative for myalgias, back pain and arthralgias.  Skin: Negative for rash.  Neurological: Negative for dizziness, syncope and headaches.  Hematological: Negative for adenopathy.  Psychiatric/Behavioral: Negative for confusion and dysphoric mood.       Objective:   Physical Exam  Constitutional: She is oriented to person, place, and time. She appears well-developed and well-nourished.  HENT:  Head: Normocephalic and atraumatic.  Eyes: EOM are normal. Pupils are equal, round, and reactive to light.  Neck: Normal range  of motion. Neck supple. No thyromegaly present.  Cardiovascular: Normal rate, regular rhythm and normal heart sounds.   No murmur heard. Pulmonary/Chest: Breath sounds normal. No respiratory distress. She has no wheezes. She has no rales.  Abdominal: Soft. Bowel sounds are normal. She exhibits no distension and no mass. There is no tenderness. There is no rebound and no guarding.  Musculoskeletal: Normal range of motion. She exhibits no edema.  Lymphadenopathy:    She has no cervical adenopathy.  Neurological: She is alert and oriented to person, place, and time. She displays normal reflexes. No cranial nerve deficit.  Skin: No rash noted.  Dark colored nevus right anterior scalp. This is in the frontal region. She has some minor border irregularity. About 6 mm diameter. Mostly symmetric  Psychiatric: She has a normal mood and affect. Her behavior is normal. Judgment and thought content normal.          Assessment & Plan:  Complete physical. Labs reviewed with no major concerns. Tetanus booster given. Flu vaccine offered and patient declines. Set up dermatology assessment for atypical nevus right scalp.  We suggested more consistent exercise. Increase dietary calcium intake

## 2014-08-11 NOTE — Addendum Note (Signed)
Addended by: Marcina Millard on: 08/11/2014 03:47 PM   Modules accepted: Orders

## 2015-08-19 ENCOUNTER — Other Ambulatory Visit (INDEPENDENT_AMBULATORY_CARE_PROVIDER_SITE_OTHER): Payer: BLUE CROSS/BLUE SHIELD

## 2015-08-19 DIAGNOSIS — Z0000 Encounter for general adult medical examination without abnormal findings: Secondary | ICD-10-CM

## 2015-08-19 LAB — CBC WITH DIFFERENTIAL/PLATELET
BASOS ABS: 0 10*3/uL (ref 0.0–0.1)
Basophils Relative: 0.4 % (ref 0.0–3.0)
EOS ABS: 0.1 10*3/uL (ref 0.0–0.7)
Eosinophils Relative: 2 % (ref 0.0–5.0)
HCT: 42.1 % (ref 36.0–46.0)
Hemoglobin: 14.5 g/dL (ref 12.0–15.0)
Lymphocytes Relative: 28.5 % (ref 12.0–46.0)
Lymphs Abs: 1.3 10*3/uL (ref 0.7–4.0)
MCHC: 34.4 g/dL (ref 30.0–36.0)
MCV: 92.4 fl (ref 78.0–100.0)
MONO ABS: 0.4 10*3/uL (ref 0.1–1.0)
Monocytes Relative: 9.7 % (ref 3.0–12.0)
Neutro Abs: 2.7 10*3/uL (ref 1.4–7.7)
Neutrophils Relative %: 59.4 % (ref 43.0–77.0)
Platelets: 213 10*3/uL (ref 150.0–400.0)
RBC: 4.56 Mil/uL (ref 3.87–5.11)
RDW: 11.9 % (ref 11.5–15.5)
WBC: 4.6 10*3/uL (ref 4.0–10.5)

## 2015-08-19 LAB — BASIC METABOLIC PANEL
BUN: 11 mg/dL (ref 6–23)
CALCIUM: 9.4 mg/dL (ref 8.4–10.5)
CO2: 27 mEq/L (ref 19–32)
Chloride: 103 mEq/L (ref 96–112)
Creatinine, Ser: 0.74 mg/dL (ref 0.40–1.20)
GFR: 98.68 mL/min (ref >60.00)
Glucose, Bld: 80 mg/dL (ref 70–99)
Potassium: 4.9 mEq/L (ref 3.5–5.1)
Sodium: 136 mEq/L (ref 135–145)

## 2015-08-19 LAB — LIPID PANEL
CHOL/HDL RATIO: 2
Cholesterol: 157 mg/dL (ref 0–200)
HDL: 65.9 mg/dL (ref >39.00)
LDL CALC: 77 mg/dL (ref 0–99)
NONHDL: 91.5
TRIGLYCERIDES: 74 mg/dL (ref 0.0–149.0)
VLDL: 14.8 mg/dL (ref 0.0–40.0)

## 2015-08-19 LAB — HEPATIC FUNCTION PANEL
ALT: 12 U/L (ref 0–35)
AST: 18 U/L (ref 0–37)
Albumin: 4.5 g/dL (ref 3.5–5.2)
Alkaline Phosphatase: 55 U/L (ref 39–117)
BILIRUBIN DIRECT: 0.1 mg/dL (ref 0.0–0.3)
BILIRUBIN TOTAL: 0.7 mg/dL (ref 0.2–1.2)
Total Protein: 7.4 g/dL (ref 6.0–8.3)

## 2015-08-19 LAB — TSH: TSH: 3.06 u[IU]/mL (ref 0.35–4.50)

## 2015-08-30 ENCOUNTER — Ambulatory Visit (INDEPENDENT_AMBULATORY_CARE_PROVIDER_SITE_OTHER): Payer: BLUE CROSS/BLUE SHIELD | Admitting: Family Medicine

## 2015-08-30 ENCOUNTER — Encounter: Payer: Self-pay | Admitting: Family Medicine

## 2015-08-30 VITALS — BP 100/80 | HR 74 | Temp 98.2°F | Ht 61.25 in | Wt 105.6 lb

## 2015-08-30 DIAGNOSIS — Z0000 Encounter for general adult medical examination without abnormal findings: Secondary | ICD-10-CM | POA: Diagnosis not present

## 2015-08-30 MED ORDER — CLONAZEPAM 0.5 MG PO TABS: 0.5000 mg | ORAL_TABLET | Freq: Two times a day (BID) | ORAL | Status: DC | PRN

## 2015-08-30 NOTE — Progress Notes (Signed)
Pre visit review using our clinic review tool, if applicable. No additional management support is needed unless otherwise documented below in the visit note. 

## 2015-08-30 NOTE — Progress Notes (Signed)
   Subjective:    Patient ID: Sherry Mccoy, female    DOB: 1985-08-31, 30 y.o.   MRN: [id number]  HPI  Patient here for physical exam. Generally very healthy. She has Mirena IUD and has not seen gynecologist in quite some time. Last Pap smear reportedly 3 years ago. She'll be getting married this September and may consider getting pregnant after then. She plans to see GYN soon and consider getting out her IUD. Immunizations up-to-date. No consistent exercise. Nonsmoker. Taken very infrequent clonazepam in past for severe anxiety symptoms. She uses this rarely usually once or twice per month.  Past Medical History  Diagnosis Date  . Arthritis     jaw  . Depression    Past Surgical History  Procedure Laterality Date  . Abd pain  2011    reports that she has never smoked. She does not have any smokeless tobacco history on file. Her alcohol and drug histories are not on file. family history includes Alcohol abuse in her mother; Cancer in her maternal grandmother; Diabetes in her paternal grandfather. No Known Allergies]  Review of Systems  Constitutional: Negative for fever, activity change, appetite change, fatigue and unexpected weight change.  HENT: Negative for ear pain, hearing loss, sore throat and trouble swallowing.   Eyes: Negative for visual disturbance.  Respiratory: Negative for cough and shortness of breath.   Cardiovascular: Negative for chest pain and palpitations.  Gastrointestinal: Negative for abdominal pain, diarrhea, constipation and blood in stool.  Genitourinary: Negative for dysuria and hematuria.  Musculoskeletal: Negative for myalgias, back pain and arthralgias.  Skin: Negative for rash.  Neurological: Negative for dizziness, syncope and headaches.  Hematological: Negative for adenopathy.  Psychiatric/Behavioral: Negative for confusion and dysphoric mood.       Objective:   Physical Exam  Constitutional: She is oriented to person, place, and time. She  appears well-developed and well-nourished.  HENT:  Head: Normocephalic and atraumatic.  Eyes: EOM are normal. Pupils are equal, round, and reactive to light.  Neck: Normal range of motion. Neck supple. No thyromegaly present.  Cardiovascular: Normal rate, regular rhythm and normal heart sounds.   No murmur heard. Pulmonary/Chest: Breath sounds normal. No respiratory distress. She has no wheezes. She has no rales.  Abdominal: Soft. Bowel sounds are normal. She exhibits no distension and no mass. There is no tenderness. There is no rebound and no guarding.  Musculoskeletal: Normal range of motion. She exhibits no edema.  Lymphadenopathy:    She has no cervical adenopathy.  Neurological: She is alert and oriented to person, place, and time. She displays normal reflexes. No cranial nerve deficit.  Skin: No rash noted.  Psychiatric: She has a normal mood and affect. Her behavior is normal. Judgment and thought content normal.          Assessment & Plan:    Physical exam. Labs reviewed. No major concerns.Recommend regular weightbearing exercise. She will follow-up with GYN regarding repeat follow-up Pap smears  We did discuss the fact that if she is looking at getting pregnant in the coming year would not recommend any further clonazepam. She had been on SSRIs in the past but anxiety symptoms are very infrequent at this juncture

## 2015-10-19 DIAGNOSIS — F4323 Adjustment disorder with mixed anxiety and depressed mood: Secondary | ICD-10-CM | POA: Diagnosis not present

## 2015-12-16 DIAGNOSIS — Z30431 Encounter for routine checking of intrauterine contraceptive device: Secondary | ICD-10-CM | POA: Diagnosis not present

## 2015-12-16 DIAGNOSIS — Z6820 Body mass index (BMI) 20.0-20.9, adult: Secondary | ICD-10-CM | POA: Diagnosis not present

## 2015-12-16 DIAGNOSIS — Z01419 Encounter for gynecological examination (general) (routine) without abnormal findings: Secondary | ICD-10-CM | POA: Diagnosis not present

## 2015-12-16 DIAGNOSIS — Z130 Encounter for screening for diseases of the blood and blood-forming organs and certain disorders involving the immune mechanism: Secondary | ICD-10-CM | POA: Diagnosis not present

## 2015-12-16 DIAGNOSIS — Z124 Encounter for screening for malignant neoplasm of cervix: Secondary | ICD-10-CM | POA: Diagnosis not present

## 2015-12-16 DIAGNOSIS — Z1151 Encounter for screening for human papillomavirus (HPV): Secondary | ICD-10-CM | POA: Diagnosis not present

## 2015-12-16 DIAGNOSIS — Z1389 Encounter for screening for other disorder: Secondary | ICD-10-CM | POA: Diagnosis not present

## 2015-12-16 LAB — HM PAP SMEAR

## 2015-12-20 ENCOUNTER — Encounter: Payer: Self-pay | Admitting: Family Medicine

## 2015-12-20 ENCOUNTER — Other Ambulatory Visit: Payer: Self-pay

## 2016-01-05 DIAGNOSIS — Z3202 Encounter for pregnancy test, result negative: Secondary | ICD-10-CM | POA: Diagnosis not present

## 2016-01-05 DIAGNOSIS — T8332XA Displacement of intrauterine contraceptive device, initial encounter: Secondary | ICD-10-CM | POA: Diagnosis not present

## 2016-01-05 DIAGNOSIS — Z30432 Encounter for removal of intrauterine contraceptive device: Secondary | ICD-10-CM | POA: Diagnosis not present

## 2016-03-07 ENCOUNTER — Telehealth: Payer: Self-pay | Admitting: Family Medicine

## 2016-03-07 NOTE — Telephone Encounter (Signed)
Pt would like to know if you could prescribe Diamox and something for travelers diarrhea  Pharm:  [facility] [address]

## 2016-03-12 NOTE — Telephone Encounter (Signed)
Pt is leaving for [place] on Friday. Please advise on both medications. She says that she will be doing a lot of hiking.

## 2016-03-12 NOTE — Telephone Encounter (Signed)
Really need to discuss issues with Diamox- several factors go into whether/how to prescribe (degree of altitude, rate of ascent, duration, etc).  Would be ideal to set up appt, if possible so we can go over.

## 2016-03-13 NOTE — Telephone Encounter (Signed)
Can we get pt scheduled ASAP to discuss medications?

## 2016-03-13 NOTE — Telephone Encounter (Signed)
Pt is scheduled tomorrow at 7:45am.

## 2016-03-14 ENCOUNTER — Ambulatory Visit (INDEPENDENT_AMBULATORY_CARE_PROVIDER_SITE_OTHER): Payer: BLUE CROSS/BLUE SHIELD | Admitting: Family Medicine

## 2016-03-14 VITALS — BP 100/60 | HR 86 | Temp 98.1°F | Ht 61.25 in | Wt 104.0 lb

## 2016-03-14 DIAGNOSIS — Z7184 Encounter for health counseling related to travel: Secondary | ICD-10-CM

## 2016-03-14 DIAGNOSIS — Z7189 Other specified counseling: Secondary | ICD-10-CM | POA: Diagnosis not present

## 2016-03-14 MED ORDER — ACETAZOLAMIDE 125 MG PO TABS: ORAL_TABLET | ORAL | 0 refills | Status: DC

## 2016-03-14 MED ORDER — CIPROFLOXACIN HCL 500 MG PO TABS: ORAL_TABLET | ORAL | 0 refills | Status: DC

## 2016-03-14 NOTE — Patient Instructions (Signed)
Altitude Sickness Altitude sickness occurs when a person goes to a high altitude (at least 8,200 ft [2,460 m] above sea level) without first letting the body adjust to the higher altitude (acclimate).Symptoms generally develop within 72 hours of arriving at high altitude. It can happen to anyone, regardless of physical condition. Altitude sickness is a medical emergency that can develop into a life-threatening condition.  CAUSES  Altitude sickness is caused by rapidly ascending to higher altitudes that expose you to lower air pressure and lower oxygen levels. Going to high altitudes quickly and exerting yourself can make altitude sickness more likely to occur.  SYMPTOMS   Severe headache.  Nausea and vomiting.  Shortness of breath.  Dizziness.  Confusion.  Uncoordinated movements.  Fatigue and sleep disturbances.  Weakness.  Hallucinations. DIAGNOSIS  Your caregiver will take your medical history and perform a physical exam. A chest X-ray may also be taken. TREATMENT  In most cases of mild altitude sickness, your symptoms will resolve gradually over 3 to 5 days. If treatment is needed, it begins with moving to a lower altitude (1,800 ft [540 m] above sea level or lower) as quickly and safely as possible. Oxygen and certain medicines may also be given to help with breathing. In severe cases, you may need to stay in the hospital. PREVENTION  To prevent altitude sickness during future trips:  Go to higher altitudes slowly, giving your body time to acclimate.  Go to higher altitudes during the daytime and return to lower altitudes at night.  Give your body a few days to adjust to a change in altitude before starting strenuous physical activities.  Ask your caregiver about medicines you can take to prevent altitude sickness. HOME CARE INSTRUCTIONS   If you must exercise, do so lightly for the first 24 to 36 hours after treatment.  Drink enough fluids to keep your urine clear or  pale yellow.  Eat small, light meals.  Avoid smoking, calming medicines (sedatives), and alcohol.  Remain at a low altitude.  Have someone stay with you until you feel stable.  Keep all follow-up appointments as directed by your caregiver. SEEK IMMEDIATE MEDICAL CARE IF:  You have severe shortness of breath at rest or with exertion.  You have chest pain or tightness.  You have a fast heartbeat.  You have a severe headache.  You have a severe cough.  You have difficulty walking.  You have difficulty concentrating.  You feel confused. MAKE SURE YOU:   Understand these instructions.  Will watch your condition.  Will get help right away if you are not doing well or get worse.   This information is not intended to replace advice given to you by your health care provider. Make sure you discuss any questions you have with your health care provider.   Document Released: 06/15/2000 Document Revised: 12/18/2011 Document Reviewed: 08/17/2011 Elsevier Interactive Patient Education [employer].

## 2016-03-14 NOTE — Progress Notes (Signed)
Subjective:     Patient ID: Sherry Mccoy, female   DOB: [birth year], 30 y.o.   MRN: [id number]  HPI Patient seen to discuss travel advice Upcoming trip to [place]. She will fly in at sea level and spend a couple days there and then will be staying a few thousand feet above sea level. She will have 5 days of doing some hikes which are day hikes up to a altitude of 14,000 feet. She specifically had questions regarding whether she should take Diamox for altitude sickness prevention  Also requesting antibiotic for traveler's diarrhea Last menstrual period was a week ago. She is currently using birth control.  Past Medical History:  Diagnosis Date  . Arthritis    jaw  . Depression    Past Surgical History:  Procedure Laterality Date  . abd pain  2011    reports that she has never smoked. She does not have any smokeless tobacco history on file. Her alcohol and drug histories are not on file. family history includes Alcohol abuse in her mother; Cancer in her maternal grandmother; Diabetes in her paternal grandfather. No Known Allergies   Review of Systems  Constitutional: Negative for chills and fever.  Respiratory: Negative for shortness of breath.   Cardiovascular: Negative for chest pain.       Objective:   Physical Exam  Constitutional: She appears well-developed and well-nourished. No distress.  Neck: Neck supple. No thyromegaly present.  Cardiovascular: Normal rate and regular rhythm.   Pulmonary/Chest: Effort normal and breath sounds normal. No respiratory distress. She has no wheezes. She has no rales.  Musculoskeletal: She exhibits no edema.       Assessment:     Travel advice encounter. Patient had specific questions regarding prevention of traveler's diarrhea and altitude sickness    Plan:     -Stay well-hydrated -Avoid alcohol -We discussed measures to reduce altitude sickness including the above plus slow gradual sent. Also, the fact that she will not be staying  overnight at altitude but will be descending each day reduces her risk. Feel she has fairly low risk of altitude sickness. We did write prescription for Diamox 125 mg twice a day that she could start 1 day prior to ascent but would try to avoid given issues above. We also reviewed multiple possible side effects of Diamox -Prescription for Cipro 500 mg twice a day for 3 days when necessary traveler's diarrhea  Eulas Post MD [facility] at [facility]

## 2016-03-14 NOTE — Progress Notes (Signed)
Pre visit review using our clinic review tool, if applicable. No additional management support is needed unless otherwise documented below in the visit note. 

## 2016-08-02 ENCOUNTER — Ambulatory Visit (INDEPENDENT_AMBULATORY_CARE_PROVIDER_SITE_OTHER): Payer: BLUE CROSS/BLUE SHIELD | Admitting: Family Medicine

## 2016-08-02 ENCOUNTER — Encounter: Payer: Self-pay | Admitting: Family Medicine

## 2016-08-02 VITALS — BP 108/62 | HR 83 | Temp 98.4°F | Ht 61.25 in | Wt 111.0 lb

## 2016-08-02 DIAGNOSIS — J029 Acute pharyngitis, unspecified: Secondary | ICD-10-CM | POA: Diagnosis not present

## 2016-08-02 LAB — POCT RAPID STREP A (OFFICE): Rapid Strep A Screen: NEGATIVE

## 2016-08-02 NOTE — Progress Notes (Signed)
Subjective:    Patient ID: Sherry Mccoy, female    DOB: [birth year], 31 y.o.   MRN: [id number]  HPI  Ms. Sherry Mccoy is a 31 year old female who presents today with a sore throat that has been present for 2 days.  Associated symptoms of ear pressure/pain, and post nasal drip present She denies fever, chills sweats, sinus pressure/pain, N/V/D, or headaches.  Treatment with OTC with Theraflu that provided moderate benefit. She reports that she is feeling "75% better" today. No influenza vaccine this season. No recent antibiotic therapy or exposure to small children.  Review of Systems  Constitutional: Negative for chills and fever.  HENT: Positive for ear pain, postnasal drip and sore throat. Negative for congestion, rhinorrhea, sinus pain and sinus pressure.   Respiratory: Negative for cough, shortness of breath and wheezing.   Cardiovascular: Negative for chest pain and palpitations.  Gastrointestinal: Negative for abdominal pain, diarrhea, nausea and vomiting.  Musculoskeletal: Negative for myalgias.  Skin: Negative for rash.   Past Medical History:  Diagnosis Date  . Arthritis    jaw  . Depression      Social History   Social History  . Marital status: Married    Spouse name: N/A  . Number of children: N/A  . Years of education: N/A   Occupational History  . Not on file.   Social History Main Topics  . Smoking status: Never Smoker  . Smokeless tobacco: Never Used  . Alcohol use Not on file  . Drug use: Unknown  . Sexual activity: Not on file   Other Topics Concern  . Not on file   Social History Narrative  . No narrative on file    Past Surgical History:  Procedure Laterality Date  . abd pain  2011    Family History  Problem Relation Age of Onset  . Alcohol abuse Mother   . Cancer Maternal Grandmother     breast  . Diabetes Paternal Grandfather     No Known Allergies  Current Outpatient Prescriptions on File Prior to Visit  Medication Sig Dispense  Refill  . acetaZOLAMIDE (DIAMOX) 125 MG tablet Take one twice a day for altitude sickness prevention starting one day prior to accent. 15 tablet 0  . ciprofloxacin (CIPRO) 500 MG tablet Take one po bid for 3 days prn traveler's diarrhea. 12 tablet 0  . clonazePAM (KLONOPIN) 0.5 MG tablet Take 1 tablet (0.5 mg total) by mouth 2 (two) times daily as needed for anxiety. 60 tablet 0   No current facility-administered medications on file prior to visit.     BP 108/62   Pulse 83   Temp 98.4 F (36.9 C)   Ht 5' 1.25" (1.556 m)   Wt 111 lb (50.3 kg)   SpO2 98%   BMI 20.80 kg/m       Objective:   Physical Exam  Constitutional: She is oriented to person, place, and time. She appears well-developed and well-nourished.  HENT:  Left Ear: Tympanic membrane normal.  Mouth/Throat: Mucous membranes are normal. Posterior oropharyngeal erythema present. No oropharyngeal exudate.  Dull TM  Eyes: Pupils are equal, round, and reactive to light. No scleral icterus.  Neck: Neck supple.  Cardiovascular: Normal rate and regular rhythm.   Pulmonary/Chest: Effort normal and breath sounds normal. She has no wheezes. She has no rales.  Lymphadenopathy:    She has cervical adenopathy.  Neurological: She is alert and oriented to person, place, and time.  Skin: Skin is warm and  dry. No rash noted.      Assessment & Plan:  1. Sore throat Rapid strep negative. Suspect symptoms are viral in nature. Advised patient on supportive measures:  Get rest, drink plenty of fluids, and use tylenol or ibuprofen as needed for pain. Follow up if fever >101, if symptoms worsen or if symptoms are not improved in 3 days. Patient verbalizes understanding.   - POCT rapid strep A  Delano Metz, FNP-C

## 2016-08-02 NOTE — Patient Instructions (Signed)
It was a pleasure to see you today!  Your symptoms today are most likely caused by viral illness. Please drink plenty of water enough for your urine to be pale yellow or clear. You may use Tylenol 325 mg every 6 hours as needed. Mucinex DM can be used for cough if needed. Follow-up for evaluation if your symptoms do not improve in 3-4 days, worsen, or you develop a fever greater than 100.  Pharyngitis Pharyngitis is redness, pain, and swelling (inflammation) of your pharynx. What are the causes? Pharyngitis is usually caused by infection. Most of the time, these infections are from viruses (viral) and are part of a cold. However, sometimes pharyngitis is caused by bacteria (bacterial). Pharyngitis can also be caused by allergies. Viral pharyngitis may be spread from person to person by coughing, sneezing, and personal items or utensils (cups, forks, spoons, toothbrushes). Bacterial pharyngitis may be spread from person to person by more intimate contact, such as kissing. What are the signs or symptoms? Symptoms of pharyngitis include:  Sore throat.  Tiredness (fatigue).  Low-grade fever.  Headache.  Joint pain and muscle aches.  Skin rashes.  Swollen lymph nodes.  Plaque-like film on throat or tonsils (often seen with bacterial pharyngitis). How is this diagnosed? Your health care provider will ask you questions about your illness and your symptoms. Your medical history, along with a physical exam, is often all that is needed to diagnose pharyngitis. Sometimes, a rapid strep test is done. Other lab tests may also be done, depending on the suspected cause. How is this treated? Viral pharyngitis will usually get better in 3-4 days without the use of medicine. Bacterial pharyngitis is treated with medicines that kill germs (antibiotics). Follow these instructions at home:  Drink enough water and fluids to keep your urine clear or pale yellow.  Only take over-the-counter or prescription  medicines as directed by your health care provider:  If you are prescribed antibiotics, make sure you finish them even if you start to feel better.  Do not take aspirin.  Get lots of rest.  Gargle with 8 oz of salt water ( tsp of salt per 1 qt of water) as often as every 1-2 hours to soothe your throat.  Throat lozenges (if you are not at risk for choking) or sprays may be used to soothe your throat. Contact a health care provider if:  You have large, tender lumps in your neck.  You have a rash.  You cough up green, yellow-brown, or bloody spit. Get help right away if:  Your neck becomes stiff.  You drool or are unable to swallow liquids.  You vomit or are unable to keep medicines or liquids down.  You have severe pain that does not go away with the use of recommended medicines.  You have trouble breathing (not caused by a stuffy nose). This information is not intended to replace advice given to you by your health care provider. Make sure you discuss any questions you have with your health care provider. Document Released: 06/18/2005 Document Revised: 11/24/2015 Document Reviewed: 02/23/2013 Elsevier Interactive Patient Education  2017 [employer].

## 2016-12-25 DIAGNOSIS — R8299 Other abnormal findings in urine: Secondary | ICD-10-CM | POA: Diagnosis not present

## 2016-12-25 DIAGNOSIS — Z1389 Encounter for screening for other disorder: Secondary | ICD-10-CM | POA: Diagnosis not present

## 2016-12-25 DIAGNOSIS — Z6821 Body mass index (BMI) 21.0-21.9, adult: Secondary | ICD-10-CM | POA: Diagnosis not present

## 2016-12-25 DIAGNOSIS — Z01419 Encounter for gynecological examination (general) (routine) without abnormal findings: Secondary | ICD-10-CM | POA: Diagnosis not present

## 2016-12-25 DIAGNOSIS — Z130 Encounter for screening for diseases of the blood and blood-forming organs and certain disorders involving the immune mechanism: Secondary | ICD-10-CM | POA: Diagnosis not present

## 2016-12-25 DIAGNOSIS — Z3169 Encounter for other general counseling and advice on procreation: Secondary | ICD-10-CM | POA: Diagnosis not present

## 2016-12-25 LAB — CBC AND DIFFERENTIAL: Hemoglobin: 12.9 (ref 12.0–16.0)

## 2016-12-26 ENCOUNTER — Encounter: Payer: Self-pay | Admitting: Family Medicine

## 2017-01-24 DIAGNOSIS — N979 Female infertility, unspecified: Secondary | ICD-10-CM | POA: Diagnosis not present

## 2017-01-30 ENCOUNTER — Ambulatory Visit (INDEPENDENT_AMBULATORY_CARE_PROVIDER_SITE_OTHER): Payer: BLUE CROSS/BLUE SHIELD | Admitting: Psychology

## 2017-01-30 DIAGNOSIS — F4322 Adjustment disorder with anxiety: Secondary | ICD-10-CM

## 2017-03-26 ENCOUNTER — Ambulatory Visit (INDEPENDENT_AMBULATORY_CARE_PROVIDER_SITE_OTHER): Payer: BLUE CROSS/BLUE SHIELD | Admitting: Psychology

## 2017-03-26 DIAGNOSIS — F4322 Adjustment disorder with anxiety: Secondary | ICD-10-CM | POA: Diagnosis not present

## 2017-04-02 ENCOUNTER — Other Ambulatory Visit (HOSPITAL_COMMUNITY): Payer: Self-pay | Admitting: Obstetrics and Gynecology

## 2017-04-02 DIAGNOSIS — N979 Female infertility, unspecified: Secondary | ICD-10-CM

## 2017-04-08 ENCOUNTER — Ambulatory Visit (HOSPITAL_COMMUNITY)
Admission: RE | Admit: 2017-04-08 | Discharge: 2017-04-08 | Disposition: A | Discharge status: Home / Self Care | Payer: BLUE CROSS/BLUE SHIELD | Source: Ambulatory Visit | Attending: Obstetrics and Gynecology | Admitting: Obstetrics and Gynecology

## 2017-04-08 ENCOUNTER — Encounter (HOSPITAL_COMMUNITY): Payer: Self-pay | Admitting: Radiology

## 2017-04-08 DIAGNOSIS — N979 Female infertility, unspecified: Secondary | ICD-10-CM | POA: Insufficient documentation

## 2017-04-08 MED ORDER — IOPAMIDOL (ISOVUE-300) INJECTION 61%
30.0000 mL | Freq: Once | INTRAVENOUS | Status: AC | PRN
  Administered 2017-04-08: 4 mL

## 2017-09-09 DIAGNOSIS — Z319 Encounter for procreative management, unspecified: Secondary | ICD-10-CM | POA: Diagnosis not present

## 2017-10-07 DIAGNOSIS — M9903 Segmental and somatic dysfunction of lumbar region: Secondary | ICD-10-CM | POA: Diagnosis not present

## 2017-10-07 DIAGNOSIS — M5386 Other specified dorsopathies, lumbar region: Secondary | ICD-10-CM | POA: Diagnosis not present

## 2017-10-07 DIAGNOSIS — M9902 Segmental and somatic dysfunction of thoracic region: Secondary | ICD-10-CM | POA: Diagnosis not present

## 2017-10-07 DIAGNOSIS — M9905 Segmental and somatic dysfunction of pelvic region: Secondary | ICD-10-CM | POA: Diagnosis not present

## 2017-10-09 DIAGNOSIS — M5386 Other specified dorsopathies, lumbar region: Secondary | ICD-10-CM | POA: Diagnosis not present

## 2017-10-09 DIAGNOSIS — M9905 Segmental and somatic dysfunction of pelvic region: Secondary | ICD-10-CM | POA: Diagnosis not present

## 2017-10-09 DIAGNOSIS — M9903 Segmental and somatic dysfunction of lumbar region: Secondary | ICD-10-CM | POA: Diagnosis not present

## 2017-10-09 DIAGNOSIS — M9902 Segmental and somatic dysfunction of thoracic region: Secondary | ICD-10-CM | POA: Diagnosis not present

## 2017-10-15 DIAGNOSIS — M9902 Segmental and somatic dysfunction of thoracic region: Secondary | ICD-10-CM | POA: Diagnosis not present

## 2017-10-15 DIAGNOSIS — M9905 Segmental and somatic dysfunction of pelvic region: Secondary | ICD-10-CM | POA: Diagnosis not present

## 2017-10-15 DIAGNOSIS — M5386 Other specified dorsopathies, lumbar region: Secondary | ICD-10-CM | POA: Diagnosis not present

## 2017-10-15 DIAGNOSIS — M9903 Segmental and somatic dysfunction of lumbar region: Secondary | ICD-10-CM | POA: Diagnosis not present

## 2017-10-17 DIAGNOSIS — M9902 Segmental and somatic dysfunction of thoracic region: Secondary | ICD-10-CM | POA: Diagnosis not present

## 2017-10-17 DIAGNOSIS — M9903 Segmental and somatic dysfunction of lumbar region: Secondary | ICD-10-CM | POA: Diagnosis not present

## 2017-10-17 DIAGNOSIS — M9905 Segmental and somatic dysfunction of pelvic region: Secondary | ICD-10-CM | POA: Diagnosis not present

## 2017-10-17 DIAGNOSIS — M5386 Other specified dorsopathies, lumbar region: Secondary | ICD-10-CM | POA: Diagnosis not present

## 2017-10-22 DIAGNOSIS — M5386 Other specified dorsopathies, lumbar region: Secondary | ICD-10-CM | POA: Diagnosis not present

## 2017-10-22 DIAGNOSIS — M9903 Segmental and somatic dysfunction of lumbar region: Secondary | ICD-10-CM | POA: Diagnosis not present

## 2017-10-22 DIAGNOSIS — M9902 Segmental and somatic dysfunction of thoracic region: Secondary | ICD-10-CM | POA: Diagnosis not present

## 2017-10-22 DIAGNOSIS — M9905 Segmental and somatic dysfunction of pelvic region: Secondary | ICD-10-CM | POA: Diagnosis not present

## 2017-10-24 DIAGNOSIS — M9902 Segmental and somatic dysfunction of thoracic region: Secondary | ICD-10-CM | POA: Diagnosis not present

## 2017-10-24 DIAGNOSIS — M9903 Segmental and somatic dysfunction of lumbar region: Secondary | ICD-10-CM | POA: Diagnosis not present

## 2017-10-24 DIAGNOSIS — M9905 Segmental and somatic dysfunction of pelvic region: Secondary | ICD-10-CM | POA: Diagnosis not present

## 2017-10-24 DIAGNOSIS — M5386 Other specified dorsopathies, lumbar region: Secondary | ICD-10-CM | POA: Diagnosis not present

## 2017-10-28 DIAGNOSIS — M5386 Other specified dorsopathies, lumbar region: Secondary | ICD-10-CM | POA: Diagnosis not present

## 2017-10-28 DIAGNOSIS — M9905 Segmental and somatic dysfunction of pelvic region: Secondary | ICD-10-CM | POA: Diagnosis not present

## 2017-10-28 DIAGNOSIS — M9902 Segmental and somatic dysfunction of thoracic region: Secondary | ICD-10-CM | POA: Diagnosis not present

## 2017-10-28 DIAGNOSIS — M9903 Segmental and somatic dysfunction of lumbar region: Secondary | ICD-10-CM | POA: Diagnosis not present

## 2017-10-31 DIAGNOSIS — M9903 Segmental and somatic dysfunction of lumbar region: Secondary | ICD-10-CM | POA: Diagnosis not present

## 2017-10-31 DIAGNOSIS — M9905 Segmental and somatic dysfunction of pelvic region: Secondary | ICD-10-CM | POA: Diagnosis not present

## 2017-10-31 DIAGNOSIS — M5386 Other specified dorsopathies, lumbar region: Secondary | ICD-10-CM | POA: Diagnosis not present

## 2017-10-31 DIAGNOSIS — M9902 Segmental and somatic dysfunction of thoracic region: Secondary | ICD-10-CM | POA: Diagnosis not present

## 2017-11-05 DIAGNOSIS — M5386 Other specified dorsopathies, lumbar region: Secondary | ICD-10-CM | POA: Diagnosis not present

## 2017-11-05 DIAGNOSIS — M9905 Segmental and somatic dysfunction of pelvic region: Secondary | ICD-10-CM | POA: Diagnosis not present

## 2017-11-05 DIAGNOSIS — M9903 Segmental and somatic dysfunction of lumbar region: Secondary | ICD-10-CM | POA: Diagnosis not present

## 2017-11-05 DIAGNOSIS — M9902 Segmental and somatic dysfunction of thoracic region: Secondary | ICD-10-CM | POA: Diagnosis not present

## 2017-11-12 DIAGNOSIS — M9902 Segmental and somatic dysfunction of thoracic region: Secondary | ICD-10-CM | POA: Diagnosis not present

## 2017-11-12 DIAGNOSIS — M9905 Segmental and somatic dysfunction of pelvic region: Secondary | ICD-10-CM | POA: Diagnosis not present

## 2017-11-12 DIAGNOSIS — M5386 Other specified dorsopathies, lumbar region: Secondary | ICD-10-CM | POA: Diagnosis not present

## 2017-11-12 DIAGNOSIS — M9903 Segmental and somatic dysfunction of lumbar region: Secondary | ICD-10-CM | POA: Diagnosis not present

## 2017-11-20 DIAGNOSIS — M9902 Segmental and somatic dysfunction of thoracic region: Secondary | ICD-10-CM | POA: Diagnosis not present

## 2017-11-20 DIAGNOSIS — M5386 Other specified dorsopathies, lumbar region: Secondary | ICD-10-CM | POA: Diagnosis not present

## 2017-11-20 DIAGNOSIS — M9903 Segmental and somatic dysfunction of lumbar region: Secondary | ICD-10-CM | POA: Diagnosis not present

## 2017-11-20 DIAGNOSIS — M9905 Segmental and somatic dysfunction of pelvic region: Secondary | ICD-10-CM | POA: Diagnosis not present

## 2017-11-28 DIAGNOSIS — M5386 Other specified dorsopathies, lumbar region: Secondary | ICD-10-CM | POA: Diagnosis not present

## 2017-11-28 DIAGNOSIS — M9903 Segmental and somatic dysfunction of lumbar region: Secondary | ICD-10-CM | POA: Diagnosis not present

## 2017-11-28 DIAGNOSIS — M9902 Segmental and somatic dysfunction of thoracic region: Secondary | ICD-10-CM | POA: Diagnosis not present

## 2017-11-28 DIAGNOSIS — M9905 Segmental and somatic dysfunction of pelvic region: Secondary | ICD-10-CM | POA: Diagnosis not present

## 2017-12-03 DIAGNOSIS — M5386 Other specified dorsopathies, lumbar region: Secondary | ICD-10-CM | POA: Diagnosis not present

## 2017-12-03 DIAGNOSIS — M9903 Segmental and somatic dysfunction of lumbar region: Secondary | ICD-10-CM | POA: Diagnosis not present

## 2017-12-03 DIAGNOSIS — M9902 Segmental and somatic dysfunction of thoracic region: Secondary | ICD-10-CM | POA: Diagnosis not present

## 2017-12-03 DIAGNOSIS — M9905 Segmental and somatic dysfunction of pelvic region: Secondary | ICD-10-CM | POA: Diagnosis not present

## 2017-12-17 DIAGNOSIS — M5386 Other specified dorsopathies, lumbar region: Secondary | ICD-10-CM | POA: Diagnosis not present

## 2017-12-17 DIAGNOSIS — M9905 Segmental and somatic dysfunction of pelvic region: Secondary | ICD-10-CM | POA: Diagnosis not present

## 2017-12-17 DIAGNOSIS — M9903 Segmental and somatic dysfunction of lumbar region: Secondary | ICD-10-CM | POA: Diagnosis not present

## 2017-12-17 DIAGNOSIS — M9902 Segmental and somatic dysfunction of thoracic region: Secondary | ICD-10-CM | POA: Diagnosis not present

## 2018-01-07 DIAGNOSIS — M5386 Other specified dorsopathies, lumbar region: Secondary | ICD-10-CM | POA: Diagnosis not present

## 2018-01-07 DIAGNOSIS — M9903 Segmental and somatic dysfunction of lumbar region: Secondary | ICD-10-CM | POA: Diagnosis not present

## 2018-01-07 DIAGNOSIS — M9902 Segmental and somatic dysfunction of thoracic region: Secondary | ICD-10-CM | POA: Diagnosis not present

## 2018-01-07 DIAGNOSIS — M9905 Segmental and somatic dysfunction of pelvic region: Secondary | ICD-10-CM | POA: Diagnosis not present

## 2018-02-07 DIAGNOSIS — Z3201 Encounter for pregnancy test, result positive: Secondary | ICD-10-CM | POA: Diagnosis not present

## 2018-02-07 DIAGNOSIS — Z3200 Encounter for pregnancy test, result unknown: Secondary | ICD-10-CM | POA: Diagnosis not present

## 2018-02-10 ENCOUNTER — Ambulatory Visit (INDEPENDENT_AMBULATORY_CARE_PROVIDER_SITE_OTHER): Payer: BLUE CROSS/BLUE SHIELD | Admitting: Psychology

## 2018-02-10 DIAGNOSIS — F4322 Adjustment disorder with anxiety: Secondary | ICD-10-CM | POA: Diagnosis not present

## 2018-02-13 ENCOUNTER — Ambulatory Visit: Payer: BLUE CROSS/BLUE SHIELD | Admitting: Psychology

## 2018-02-19 DIAGNOSIS — Z3200 Encounter for pregnancy test, result unknown: Secondary | ICD-10-CM | POA: Diagnosis not present

## 2018-03-05 DIAGNOSIS — O0900 Supervision of pregnancy with history of infertility, unspecified trimester: Secondary | ICD-10-CM | POA: Diagnosis not present

## 2018-03-18 DIAGNOSIS — Z3A10 10 weeks gestation of pregnancy: Secondary | ICD-10-CM | POA: Diagnosis not present

## 2018-03-18 DIAGNOSIS — Z113 Encounter for screening for infections with a predominantly sexual mode of transmission: Secondary | ICD-10-CM | POA: Diagnosis not present

## 2018-03-18 DIAGNOSIS — Z3401 Encounter for supervision of normal first pregnancy, first trimester: Secondary | ICD-10-CM | POA: Diagnosis not present

## 2018-03-18 DIAGNOSIS — Z3689 Encounter for other specified antenatal screening: Secondary | ICD-10-CM | POA: Diagnosis not present

## 2018-03-20 DIAGNOSIS — M5386 Other specified dorsopathies, lumbar region: Secondary | ICD-10-CM | POA: Diagnosis not present

## 2018-03-20 DIAGNOSIS — M9903 Segmental and somatic dysfunction of lumbar region: Secondary | ICD-10-CM | POA: Diagnosis not present

## 2018-03-20 DIAGNOSIS — M9902 Segmental and somatic dysfunction of thoracic region: Secondary | ICD-10-CM | POA: Diagnosis not present

## 2018-03-20 DIAGNOSIS — M9905 Segmental and somatic dysfunction of pelvic region: Secondary | ICD-10-CM | POA: Diagnosis not present

## 2018-03-27 ENCOUNTER — Ambulatory Visit: Payer: Self-pay | Admitting: Psychology

## 2018-04-03 DIAGNOSIS — Z3689 Encounter for other specified antenatal screening: Secondary | ICD-10-CM | POA: Diagnosis not present

## 2018-04-17 DIAGNOSIS — Z3A14 14 weeks gestation of pregnancy: Secondary | ICD-10-CM | POA: Diagnosis not present

## 2018-04-17 DIAGNOSIS — Z3402 Encounter for supervision of normal first pregnancy, second trimester: Secondary | ICD-10-CM | POA: Diagnosis not present

## 2018-04-23 ENCOUNTER — Ambulatory Visit: Payer: BLUE CROSS/BLUE SHIELD | Admitting: Psychology

## 2018-04-24 DIAGNOSIS — M9905 Segmental and somatic dysfunction of pelvic region: Secondary | ICD-10-CM | POA: Diagnosis not present

## 2018-04-24 DIAGNOSIS — M5386 Other specified dorsopathies, lumbar region: Secondary | ICD-10-CM | POA: Diagnosis not present

## 2018-04-24 DIAGNOSIS — M9902 Segmental and somatic dysfunction of thoracic region: Secondary | ICD-10-CM | POA: Diagnosis not present

## 2018-04-24 DIAGNOSIS — M9903 Segmental and somatic dysfunction of lumbar region: Secondary | ICD-10-CM | POA: Diagnosis not present

## 2018-05-04 DIAGNOSIS — Z363 Encounter for antenatal screening for malformations: Secondary | ICD-10-CM | POA: Diagnosis not present

## 2018-05-04 DIAGNOSIS — Z3A18 18 weeks gestation of pregnancy: Secondary | ICD-10-CM | POA: Diagnosis not present

## 2018-05-04 DIAGNOSIS — Z3689 Encounter for other specified antenatal screening: Secondary | ICD-10-CM | POA: Diagnosis not present

## 2018-05-04 DIAGNOSIS — Z3490 Encounter for supervision of normal pregnancy, unspecified, unspecified trimester: Secondary | ICD-10-CM | POA: Diagnosis not present

## 2018-05-19 DIAGNOSIS — Z3A18 18 weeks gestation of pregnancy: Secondary | ICD-10-CM | POA: Diagnosis not present

## 2018-05-19 DIAGNOSIS — Z3689 Encounter for other specified antenatal screening: Secondary | ICD-10-CM | POA: Diagnosis not present

## 2018-05-19 DIAGNOSIS — Z363 Encounter for antenatal screening for malformations: Secondary | ICD-10-CM | POA: Diagnosis not present

## 2018-05-21 DIAGNOSIS — M9903 Segmental and somatic dysfunction of lumbar region: Secondary | ICD-10-CM | POA: Diagnosis not present

## 2018-05-21 DIAGNOSIS — M9902 Segmental and somatic dysfunction of thoracic region: Secondary | ICD-10-CM | POA: Diagnosis not present

## 2018-05-21 DIAGNOSIS — M5386 Other specified dorsopathies, lumbar region: Secondary | ICD-10-CM | POA: Diagnosis not present

## 2018-05-21 DIAGNOSIS — M9905 Segmental and somatic dysfunction of pelvic region: Secondary | ICD-10-CM | POA: Diagnosis not present

## 2018-05-28 ENCOUNTER — Encounter: Payer: Self-pay | Admitting: Family Medicine

## 2018-05-28 ENCOUNTER — Ambulatory Visit: Payer: BLUE CROSS/BLUE SHIELD | Admitting: Family Medicine

## 2018-05-28 VITALS — BP 88/62 | HR 86 | Temp 98.3°F | Ht 61.25 in | Wt 108.0 lb

## 2018-05-28 DIAGNOSIS — J029 Acute pharyngitis, unspecified: Secondary | ICD-10-CM | POA: Diagnosis not present

## 2018-05-28 LAB — POCT RAPID STREP A (OFFICE): Rapid Strep A Screen: NEGATIVE

## 2018-05-28 MED ORDER — AMOXICILLIN 875 MG PO TABS: 875.0000 mg | ORAL_TABLET | Freq: Two times a day (BID) | ORAL | 0 refills | Status: DC

## 2018-05-28 NOTE — Patient Instructions (Signed)

## 2018-05-28 NOTE — Progress Notes (Signed)
  Subjective:     Patient ID: Sherry Mccoy, female   DOB: 1985-11-19, 32 y.o.   MRN: [id number]  HPI Patient seen with sore throat.  Onset a few days ago.  She is had some bilateral earache which she thinks may be radiation from the ears.  No fever.  No cough.  No nasal congestion.  No adenopathy.  She is [redacted] weeks pregnant and things are going well with her pregnancy.  No sick exposures.  No nausea or vomiting.  No rash.  Past Medical History:  Diagnosis Date  . Arthritis    jaw  . Depression    Past Surgical History:  Procedure Laterality Date  . abd pain  2011    reports that she has never smoked. She has never used smokeless tobacco. Her alcohol and drug histories are not on file. family history includes Alcohol abuse in her mother; Cancer in her maternal grandmother; Diabetes in her paternal grandfather. No Known Allergies   Review of Systems  Constitutional: Negative for chills and fever.  HENT: Positive for sore throat. Negative for congestion.   Respiratory: Negative for cough and shortness of breath.   Cardiovascular: Negative for chest pain.       Objective:   Physical Exam  Constitutional: She appears well-developed and well-nourished.  HENT:  Right Ear: Tympanic membrane normal.  Left Ear: Tympanic membrane normal.  Mouth/Throat: Posterior oropharyngeal erythema present. No oropharyngeal exudate.  Neck: Neck supple.  Cardiovascular: Normal rate and regular rhythm.  Lymphadenopathy:    She has no cervical adenopathy.       Assessment:     Sore throat.  Rapid strep negative.    Plan:     -Obtain throat culture -Printed prescription for amoxicillin but would not start unless she has any fever or other worsening symptoms.  Will treat symptomatically with Tylenol.  Eulas Post MD [facility] at [facility]

## 2018-05-28 NOTE — Progress Notes (Signed)
ra

## 2018-05-30 LAB — CULTURE, GROUP A STREP
MICRO NUMBER:: 91431356
SPECIMEN QUALITY:: ADEQUATE

## 2018-06-18 DIAGNOSIS — M9902 Segmental and somatic dysfunction of thoracic region: Secondary | ICD-10-CM | POA: Diagnosis not present

## 2018-06-18 DIAGNOSIS — M5386 Other specified dorsopathies, lumbar region: Secondary | ICD-10-CM | POA: Diagnosis not present

## 2018-06-18 DIAGNOSIS — M9903 Segmental and somatic dysfunction of lumbar region: Secondary | ICD-10-CM | POA: Diagnosis not present

## 2018-06-18 DIAGNOSIS — M9905 Segmental and somatic dysfunction of pelvic region: Secondary | ICD-10-CM | POA: Diagnosis not present

## 2018-07-16 DIAGNOSIS — Z23 Encounter for immunization: Secondary | ICD-10-CM | POA: Diagnosis not present

## 2018-07-16 DIAGNOSIS — Z3402 Encounter for supervision of normal first pregnancy, second trimester: Secondary | ICD-10-CM | POA: Diagnosis not present

## 2018-07-16 DIAGNOSIS — Z6821 Body mass index (BMI) 21.0-21.9, adult: Secondary | ICD-10-CM | POA: Diagnosis not present

## 2018-07-17 DIAGNOSIS — M9902 Segmental and somatic dysfunction of thoracic region: Secondary | ICD-10-CM | POA: Diagnosis not present

## 2018-07-17 DIAGNOSIS — M9905 Segmental and somatic dysfunction of pelvic region: Secondary | ICD-10-CM | POA: Diagnosis not present

## 2018-07-17 DIAGNOSIS — M9903 Segmental and somatic dysfunction of lumbar region: Secondary | ICD-10-CM | POA: Diagnosis not present

## 2018-07-17 DIAGNOSIS — M5386 Other specified dorsopathies, lumbar region: Secondary | ICD-10-CM | POA: Diagnosis not present

## 2018-07-22 ENCOUNTER — Ambulatory Visit (INDEPENDENT_AMBULATORY_CARE_PROVIDER_SITE_OTHER): Payer: Self-pay | Admitting: Pediatrics

## 2018-07-22 ENCOUNTER — Encounter: Payer: Self-pay | Admitting: Pediatrics

## 2018-07-22 DIAGNOSIS — Z7681 Expectant parent(s) prebirth pediatrician visit: Secondary | ICD-10-CM

## 2018-07-22 NOTE — Progress Notes (Signed)
Prenatal counseling for impending newborn done-- Z76.81  

## 2018-08-14 DIAGNOSIS — M5386 Other specified dorsopathies, lumbar region: Secondary | ICD-10-CM | POA: Diagnosis not present

## 2018-08-14 DIAGNOSIS — M9902 Segmental and somatic dysfunction of thoracic region: Secondary | ICD-10-CM | POA: Diagnosis not present

## 2018-08-14 DIAGNOSIS — M9905 Segmental and somatic dysfunction of pelvic region: Secondary | ICD-10-CM | POA: Diagnosis not present

## 2018-08-14 DIAGNOSIS — M9903 Segmental and somatic dysfunction of lumbar region: Secondary | ICD-10-CM | POA: Diagnosis not present

## 2018-08-20 DIAGNOSIS — Z6822 Body mass index (BMI) 22.0-22.9, adult: Secondary | ICD-10-CM | POA: Diagnosis not present

## 2018-08-20 DIAGNOSIS — Z3403 Encounter for supervision of normal first pregnancy, third trimester: Secondary | ICD-10-CM | POA: Diagnosis not present

## 2018-09-04 DIAGNOSIS — M5386 Other specified dorsopathies, lumbar region: Secondary | ICD-10-CM | POA: Diagnosis not present

## 2018-09-04 DIAGNOSIS — M9902 Segmental and somatic dysfunction of thoracic region: Secondary | ICD-10-CM | POA: Diagnosis not present

## 2018-09-04 DIAGNOSIS — M9905 Segmental and somatic dysfunction of pelvic region: Secondary | ICD-10-CM | POA: Diagnosis not present

## 2018-09-04 DIAGNOSIS — M9903 Segmental and somatic dysfunction of lumbar region: Secondary | ICD-10-CM | POA: Diagnosis not present

## 2018-09-15 DIAGNOSIS — Z3400 Encounter for supervision of normal first pregnancy, unspecified trimester: Secondary | ICD-10-CM | POA: Diagnosis not present

## 2018-09-16 ENCOUNTER — Other Ambulatory Visit (HOSPITAL_COMMUNITY): Payer: Self-pay | Admitting: Certified Nurse Midwife

## 2018-09-16 DIAGNOSIS — Z3689 Encounter for other specified antenatal screening: Secondary | ICD-10-CM

## 2018-09-16 DIAGNOSIS — O26843 Uterine size-date discrepancy, third trimester: Secondary | ICD-10-CM

## 2018-09-16 DIAGNOSIS — Z3A35 35 weeks gestation of pregnancy: Secondary | ICD-10-CM

## 2018-09-18 DIAGNOSIS — O365990 Maternal care for other known or suspected poor fetal growth, unspecified trimester, not applicable or unspecified: Secondary | ICD-10-CM | POA: Diagnosis not present

## 2018-09-18 DIAGNOSIS — Z3A36 36 weeks gestation of pregnancy: Secondary | ICD-10-CM | POA: Diagnosis not present

## 2018-09-18 DIAGNOSIS — O26843 Uterine size-date discrepancy, third trimester: Secondary | ICD-10-CM | POA: Diagnosis not present

## 2018-09-18 DIAGNOSIS — O365930 Maternal care for other known or suspected poor fetal growth, third trimester, not applicable or unspecified: Secondary | ICD-10-CM | POA: Diagnosis not present

## 2018-09-22 ENCOUNTER — Ambulatory Visit (HOSPITAL_COMMUNITY): Payer: Self-pay

## 2018-09-22 ENCOUNTER — Encounter (HOSPITAL_COMMUNITY): Payer: Self-pay

## 2018-09-22 ENCOUNTER — Ambulatory Visit (HOSPITAL_COMMUNITY): Payer: BLUE CROSS/BLUE SHIELD

## 2018-09-23 DIAGNOSIS — O365930 Maternal care for other known or suspected poor fetal growth, third trimester, not applicable or unspecified: Secondary | ICD-10-CM | POA: Diagnosis not present

## 2018-09-23 DIAGNOSIS — O368130 Decreased fetal movements, third trimester, not applicable or unspecified: Secondary | ICD-10-CM | POA: Diagnosis not present

## 2018-09-24 DIAGNOSIS — Z3A37 37 weeks gestation of pregnancy: Secondary | ICD-10-CM | POA: Diagnosis not present

## 2018-09-24 DIAGNOSIS — O365930 Maternal care for other known or suspected poor fetal growth, third trimester, not applicable or unspecified: Secondary | ICD-10-CM | POA: Diagnosis not present

## 2018-09-24 DIAGNOSIS — O368130 Decreased fetal movements, third trimester, not applicable or unspecified: Secondary | ICD-10-CM | POA: Diagnosis not present

## 2018-09-27 DIAGNOSIS — Z3A37 37 weeks gestation of pregnancy: Secondary | ICD-10-CM | POA: Diagnosis not present

## 2018-09-27 DIAGNOSIS — O1413 Severe pre-eclampsia, third trimester: Secondary | ICD-10-CM | POA: Diagnosis not present

## 2018-09-27 DIAGNOSIS — O365930 Maternal care for other known or suspected poor fetal growth, third trimester, not applicable or unspecified: Secondary | ICD-10-CM | POA: Diagnosis not present

## 2018-09-27 DIAGNOSIS — O1414 Severe pre-eclampsia complicating childbirth: Secondary | ICD-10-CM | POA: Diagnosis not present

## 2018-09-27 DIAGNOSIS — O1493 Unspecified pre-eclampsia, third trimester: Secondary | ICD-10-CM | POA: Diagnosis not present

## 2018-09-28 DIAGNOSIS — O365930 Maternal care for other known or suspected poor fetal growth, third trimester, not applicable or unspecified: Secondary | ICD-10-CM | POA: Diagnosis not present

## 2018-09-28 DIAGNOSIS — O1493 Unspecified pre-eclampsia, third trimester: Secondary | ICD-10-CM | POA: Diagnosis not present

## 2018-09-28 DIAGNOSIS — O1413 Severe pre-eclampsia, third trimester: Secondary | ICD-10-CM | POA: Diagnosis not present

## 2018-09-28 DIAGNOSIS — O1414 Severe pre-eclampsia complicating childbirth: Secondary | ICD-10-CM | POA: Diagnosis not present

## 2018-09-28 DIAGNOSIS — Z3A37 37 weeks gestation of pregnancy: Secondary | ICD-10-CM | POA: Diagnosis not present

## 2018-10-02 DIAGNOSIS — O9123 Nonpurulent mastitis associated with lactation: Secondary | ICD-10-CM | POA: Diagnosis not present

## 2018-10-07 DIAGNOSIS — O9123 Nonpurulent mastitis associated with lactation: Secondary | ICD-10-CM | POA: Diagnosis not present

## 2018-11-28 DIAGNOSIS — Z130 Encounter for screening for diseases of the blood and blood-forming organs and certain disorders involving the immune mechanism: Secondary | ICD-10-CM | POA: Diagnosis not present

## 2018-11-28 DIAGNOSIS — Z124 Encounter for screening for malignant neoplasm of cervix: Secondary | ICD-10-CM | POA: Diagnosis not present

## 2018-11-28 DIAGNOSIS — Z01419 Encounter for gynecological examination (general) (routine) without abnormal findings: Secondary | ICD-10-CM | POA: Diagnosis not present

## 2018-11-28 DIAGNOSIS — Z1151 Encounter for screening for human papillomavirus (HPV): Secondary | ICD-10-CM | POA: Diagnosis not present

## 2019-02-19 ENCOUNTER — Ambulatory Visit: Payer: Self-pay

## 2019-02-19 NOTE — Telephone Encounter (Signed)
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 Pt denies symptoms.  She was given location of testing sites and symptoms were reviewed. Per Care advice. Patient has 59 month old son.  She was advised to call his pediatrician for advice. Patient will quarantine for 14 day post exposure. Patient verbalized understanding of all instructions.  Reason for Disposition . [1] COVID-19 EXPOSURE (Close Contact) AND [2] within last 14 days BUT [3] NO symptoms  Answer Assessment - Initial Assessment Questions 1. CLOSE CONTACT: "Who is the person with the confirmed or suspected COVID-19 infection that you were exposed to?"     Co worker 2. PLACE of CONTACT: "Where were you when you were exposed to COVID-19?" (e.g., home, school, medical waiting room; which city?)     work 3. TYPE of CONTACT: "How much contact was there?" (e.g., sitting next to, live in same house, work in same office, same building)    Same office 4. DURATION of CONTACT: "How long were you in contact with the COVID-19 patient?" (e.g., a few seconds, passed by person, a few minutes, live with the patient)     3 hours together 5. DATE of CONTACT: "When did you have contact with a COVID-19 patient?" (e.g., how many days ago)     Saturday 6. TRAVEL: "Have you traveled out of the country recently?" If so, "When and where?"     * Also ask about out-of-state travel, since the CDC has identified some high-risk cities for community spread in the [place].     * Note: Travel becomes less relevant if there is widespread community transmission where the patient lives.     [place] 7. COMMUNITY SPREAD: "Are there lots of cases of COVID-19 (community spread) where you live?" (See public health department website, if unsure)      [place] 8. SYMPTOMS: "Do you have any symptoms?" (e.g., fever, cough, breathing difficulty)    None 9. PREGNANCY OR  POSTPARTUM: "Is there any chance you are pregnant?" "When was your last menstrual period?" "Did you deliver in the last 2 weeks?"   Sone 4 months old 12. HIGH RISK: "Do you have any heart or lung problems? Do you have a weak immune system?" (e.g., CHF, COPD, asthma, HIV positive, chemotherapy, renal failure, diabetes mellitus, sickle cell anemia)       none  Protocols used: CORONAVIRUS (COVID-19) EXPOSURE-A-AH

## 2019-02-20 ENCOUNTER — Telehealth: Payer: Self-pay | Admitting: Family Medicine

## 2019-02-20 ENCOUNTER — Other Ambulatory Visit: Payer: Self-pay

## 2019-02-20 DIAGNOSIS — Z20822 Contact with and (suspected) exposure to covid-19: Secondary | ICD-10-CM

## 2019-02-20 NOTE — Telephone Encounter (Signed)
Please call and schedule virtual visit.   Copied from [place] [id number]. Topic: General - Other >> Feb 19, 2019  5:20 PM [name] wrote: Reason for CRM: Pt stated she came in to contact with a person who tested positive on Saturday and she would like an order to be placed for Covid-19 testing.

## 2019-02-20 NOTE — Telephone Encounter (Signed)
We are full today. OK to have her go get tested? Set up a Doxy on Monday?

## 2019-02-20 NOTE — Telephone Encounter (Signed)
Called patient and she stated that she has already went to get tested this morning. I let her know to stay quarantined until her test returns and to be aware of her symptoms. Patient verbalized an understanding.

## 2019-02-20 NOTE — Telephone Encounter (Signed)
I would go ahead and test- OK to place order.

## 2019-02-21 LAB — NOVEL CORONAVIRUS, NAA: SARS-CoV-2, NAA: NOT DETECTED

## 2019-02-23 ENCOUNTER — Ambulatory Visit (INDEPENDENT_AMBULATORY_CARE_PROVIDER_SITE_OTHER): Payer: BC Managed Care – PPO | Admitting: Psychology

## 2019-02-23 DIAGNOSIS — F4322 Adjustment disorder with anxiety: Secondary | ICD-10-CM | POA: Diagnosis not present

## 2019-03-17 ENCOUNTER — Encounter: Payer: Self-pay | Admitting: Family Medicine

## 2019-03-26 ENCOUNTER — Ambulatory Visit (INDEPENDENT_AMBULATORY_CARE_PROVIDER_SITE_OTHER): Payer: BC Managed Care – PPO | Admitting: Psychology

## 2019-03-26 DIAGNOSIS — F4322 Adjustment disorder with anxiety: Secondary | ICD-10-CM | POA: Diagnosis not present

## 2019-04-09 IMAGING — RF DG HYSTEROGRAM
3 series · 3 of 3 positions shown · IV contrast (omnipaque)
Comparison: None.

CLINICAL DATA: Infertility evaluation

EXAM:
HYSTEROSALPINGOGRAM
TECHNIQUE: Following cleansing of the cervix and vagina with Betadine solution,
a hysterosalpingogram was performed using a 5-French
hysterosalpingogram catheter and Omnipaque 300 contrast. The patient
tolerated the examination without difficulty.

[Series 1: run · 1 of 1 slices shown (1 of 3)]
[im 1/1]
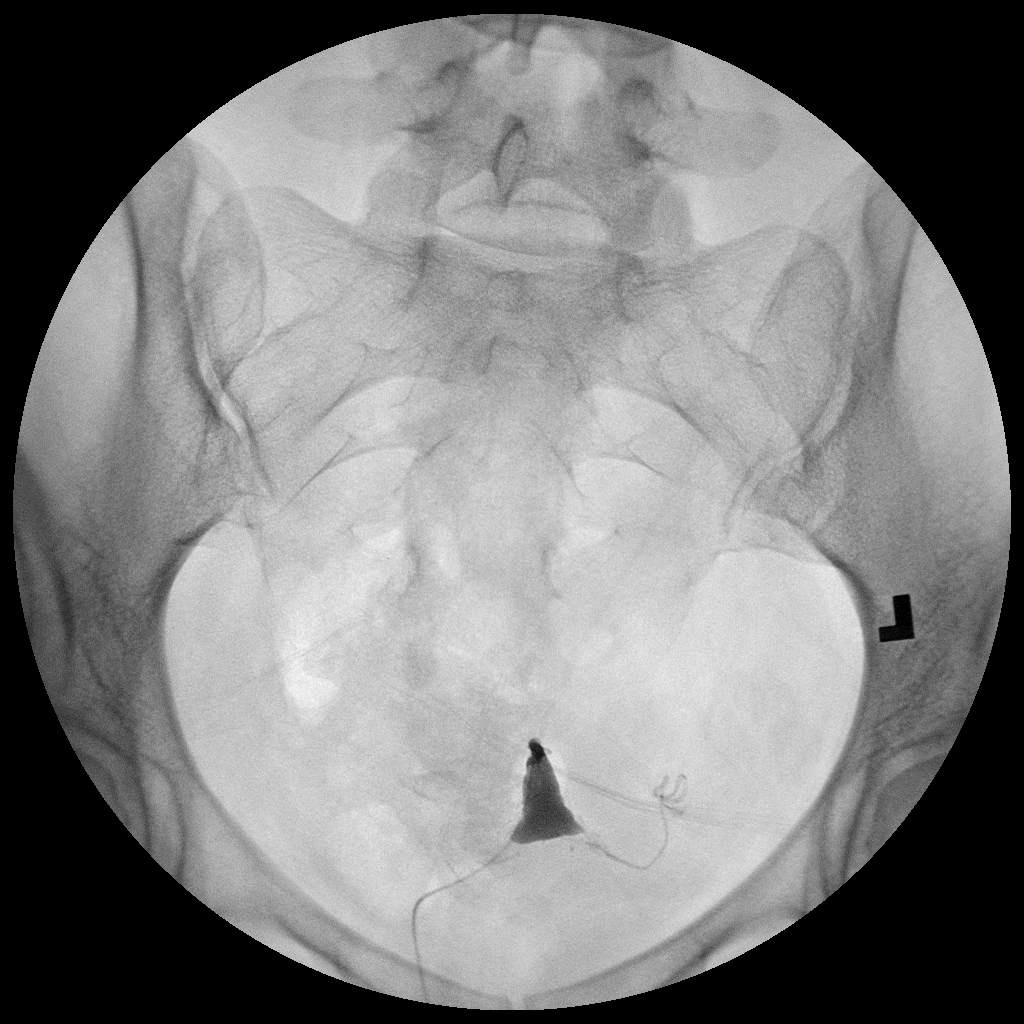

[Series 2: run · 1 of 1 slices shown (2 of 3)]
[im 1/1]
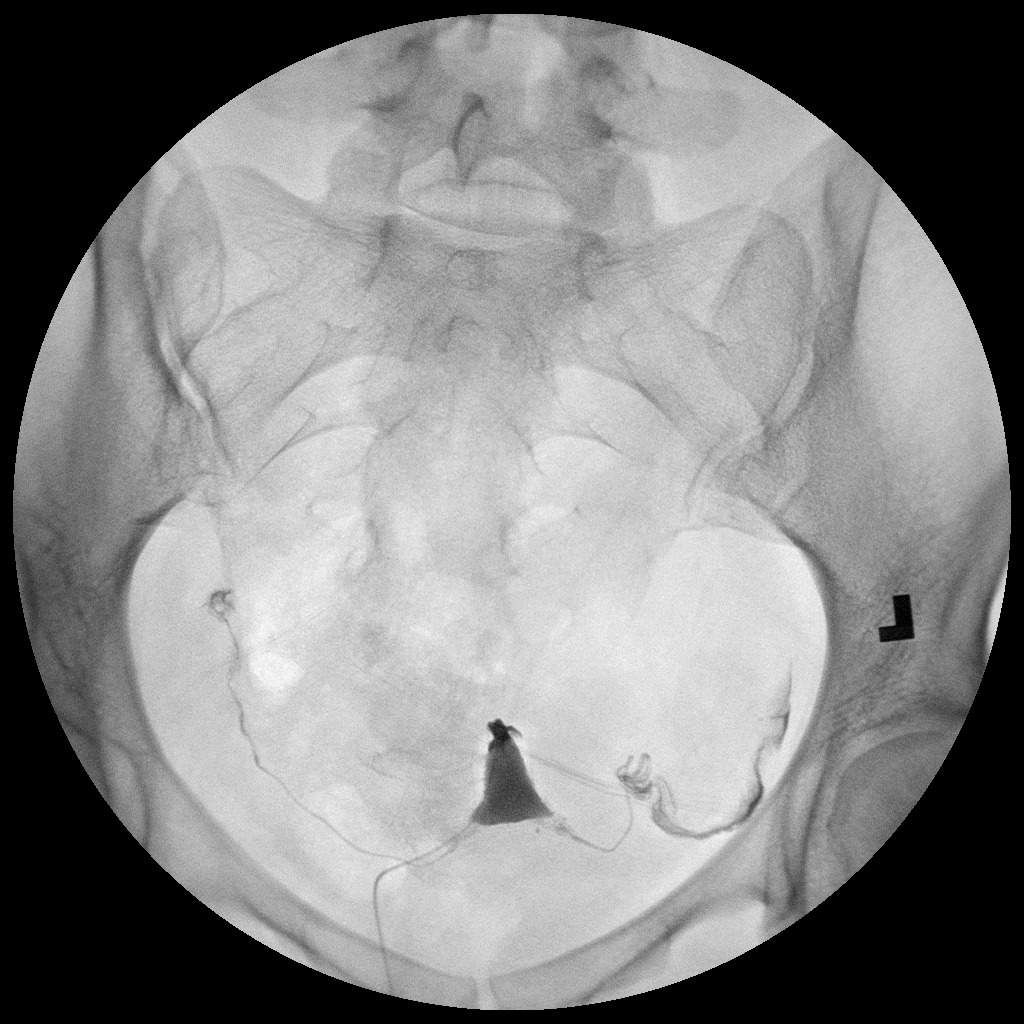

[Series 3: run · 1 of 1 slices shown (3 of 3)]
[im 1/1]
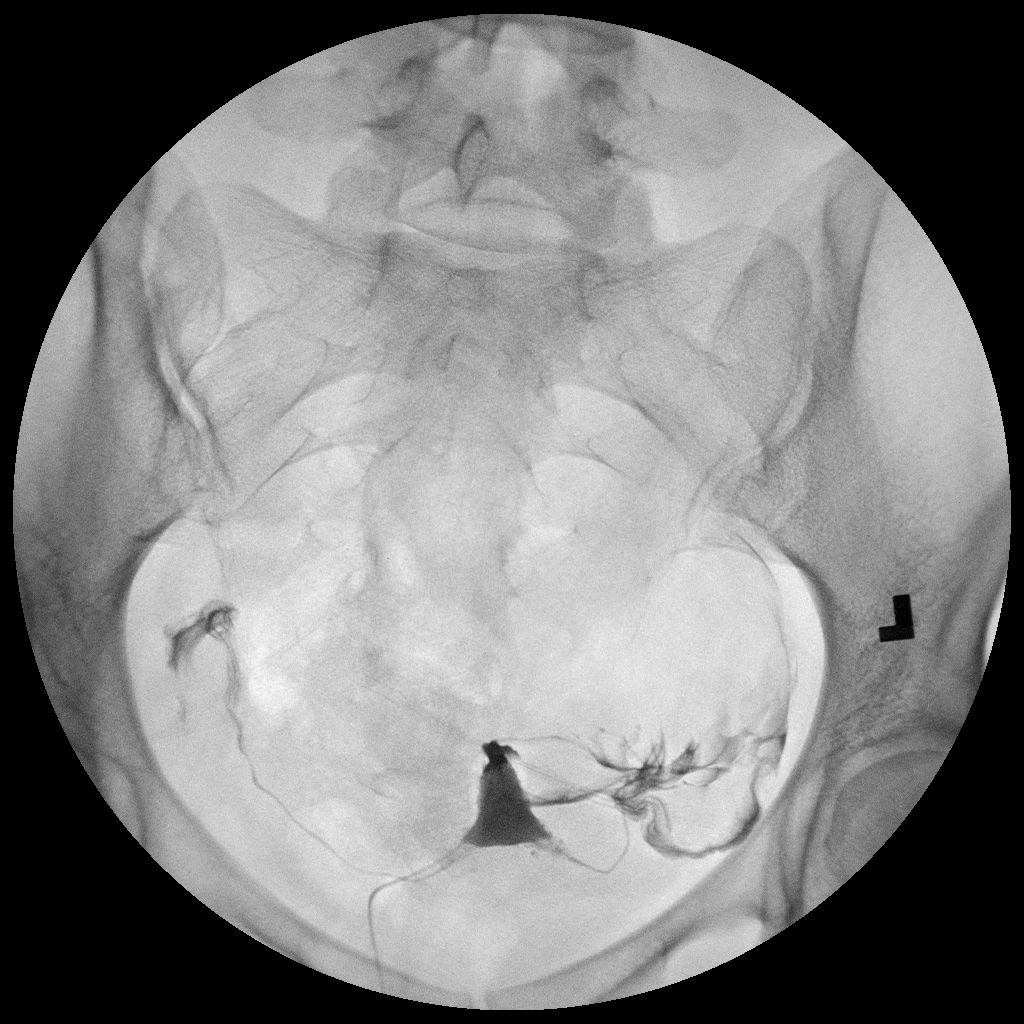

[3 of 3 positions shown; findings below may reference images not displayed]

FLUOROSCOPY TIME:  Radiation Exposure Index (as provided by the
fluoroscopic device):

If the device does not provide the exposure index:

Fluoroscopy Time:  42 seconds

Number of Acquired Images:  3
FINDINGS: Endometrial cavity is normal. Both fallopian tubes fill and have a
normal appearance. Normal spillage bilaterally.
IMPRESSION: Normal study.

## 2019-04-22 ENCOUNTER — Ambulatory Visit (INDEPENDENT_AMBULATORY_CARE_PROVIDER_SITE_OTHER): Payer: BC Managed Care – PPO | Admitting: Psychology

## 2019-04-22 DIAGNOSIS — F4322 Adjustment disorder with anxiety: Secondary | ICD-10-CM | POA: Diagnosis not present

## 2019-05-07 ENCOUNTER — Ambulatory Visit (INDEPENDENT_AMBULATORY_CARE_PROVIDER_SITE_OTHER): Payer: BC Managed Care – PPO | Admitting: Psychology

## 2019-05-07 DIAGNOSIS — F4322 Adjustment disorder with anxiety: Secondary | ICD-10-CM | POA: Diagnosis not present

## 2019-05-22 ENCOUNTER — Ambulatory Visit (INDEPENDENT_AMBULATORY_CARE_PROVIDER_SITE_OTHER): Payer: BC Managed Care – PPO | Admitting: Psychology

## 2019-05-22 DIAGNOSIS — F4322 Adjustment disorder with anxiety: Secondary | ICD-10-CM | POA: Diagnosis not present

## 2019-06-19 ENCOUNTER — Ambulatory Visit (INDEPENDENT_AMBULATORY_CARE_PROVIDER_SITE_OTHER): Payer: BC Managed Care – PPO | Admitting: Psychology

## 2019-06-19 DIAGNOSIS — F4322 Adjustment disorder with anxiety: Secondary | ICD-10-CM | POA: Diagnosis not present

## 2019-07-16 ENCOUNTER — Ambulatory Visit (INDEPENDENT_AMBULATORY_CARE_PROVIDER_SITE_OTHER): Payer: BC Managed Care – PPO | Admitting: Psychology

## 2019-07-16 DIAGNOSIS — F4322 Adjustment disorder with anxiety: Secondary | ICD-10-CM | POA: Diagnosis not present

## 2019-07-30 ENCOUNTER — Ambulatory Visit (INDEPENDENT_AMBULATORY_CARE_PROVIDER_SITE_OTHER): Payer: BC Managed Care – PPO | Admitting: Psychology

## 2019-07-30 DIAGNOSIS — F4322 Adjustment disorder with anxiety: Secondary | ICD-10-CM | POA: Diagnosis not present

## 2019-08-06 DIAGNOSIS — Z3041 Encounter for surveillance of contraceptive pills: Secondary | ICD-10-CM | POA: Diagnosis not present

## 2019-08-06 DIAGNOSIS — F520 Hypoactive sexual desire disorder: Secondary | ICD-10-CM | POA: Diagnosis not present

## 2019-08-12 ENCOUNTER — Ambulatory Visit (INDEPENDENT_AMBULATORY_CARE_PROVIDER_SITE_OTHER): Payer: BC Managed Care – PPO | Admitting: Psychology

## 2019-08-12 DIAGNOSIS — F4322 Adjustment disorder with anxiety: Secondary | ICD-10-CM

## 2019-08-25 ENCOUNTER — Ambulatory Visit (INDEPENDENT_AMBULATORY_CARE_PROVIDER_SITE_OTHER): Payer: BC Managed Care – PPO | Admitting: Psychology

## 2019-08-25 DIAGNOSIS — F4322 Adjustment disorder with anxiety: Secondary | ICD-10-CM

## 2019-09-16 ENCOUNTER — Ambulatory Visit (INDEPENDENT_AMBULATORY_CARE_PROVIDER_SITE_OTHER): Payer: BC Managed Care – PPO | Admitting: Psychology

## 2019-09-16 DIAGNOSIS — F4322 Adjustment disorder with anxiety: Secondary | ICD-10-CM

## 2019-10-07 ENCOUNTER — Ambulatory Visit: Payer: BC Managed Care – PPO | Attending: Internal Medicine

## 2019-10-07 ENCOUNTER — Ambulatory Visit: Payer: BC Managed Care – PPO | Admitting: Psychology

## 2019-10-07 DIAGNOSIS — Z20822 Contact with and (suspected) exposure to covid-19: Secondary | ICD-10-CM

## 2019-10-08 LAB — NOVEL CORONAVIRUS, NAA: SARS-CoV-2, NAA: NOT DETECTED

## 2019-10-08 LAB — SARS-COV-2, NAA 2 DAY TAT

## 2019-10-12 ENCOUNTER — Other Ambulatory Visit: Payer: BC Managed Care – PPO

## 2020-02-10 DIAGNOSIS — Z130 Encounter for screening for diseases of the blood and blood-forming organs and certain disorders involving the immune mechanism: Secondary | ICD-10-CM | POA: Diagnosis not present

## 2020-02-10 DIAGNOSIS — Z01419 Encounter for gynecological examination (general) (routine) without abnormal findings: Secondary | ICD-10-CM | POA: Diagnosis not present

## 2020-02-10 DIAGNOSIS — Z681 Body mass index (BMI) 19 or less, adult: Secondary | ICD-10-CM | POA: Diagnosis not present

## 2020-02-10 DIAGNOSIS — Z1389 Encounter for screening for other disorder: Secondary | ICD-10-CM | POA: Diagnosis not present

## 2020-02-18 DIAGNOSIS — Z20822 Contact with and (suspected) exposure to covid-19: Secondary | ICD-10-CM | POA: Diagnosis not present

## 2023-05-22 ENCOUNTER — Ambulatory Visit: Payer: BC Managed Care – PPO | Admitting: Family Medicine

## 2023-05-22 ENCOUNTER — Encounter: Payer: Self-pay | Admitting: Family Medicine

## 2023-05-22 VITALS — BP 100/70 | HR 95 | Temp 98.3°F | Ht 61.25 in | Wt 96.7 lb

## 2023-05-22 DIAGNOSIS — W57XXXA Bitten or stung by nonvenomous insect and other nonvenomous arthropods, initial encounter: Secondary | ICD-10-CM

## 2023-05-22 DIAGNOSIS — S80862A Insect bite (nonvenomous), left lower leg, initial encounter: Secondary | ICD-10-CM

## 2023-05-22 MED ORDER — TRIAMCINOLONE ACETONIDE 0.1 % EX CREA: 1.0000 | TOPICAL_CREAM | Freq: Two times a day (BID) | CUTANEOUS | 0 refills | Status: AC

## 2023-05-22 NOTE — Patient Instructions (Signed)
May supplement with daytime anti-histamine such as Zyrtec, Allegra, or Claritan.

## 2023-05-22 NOTE — Progress Notes (Signed)
   Established Patient Office Visit  Subjective   Patient ID: Sherry Mccoy, female    DOB: Dec 27, 1985  Age: 37 y.o. MRN: [id number]  Chief Complaint  Patient presents with   Insect Bite    Patient complains of insect bite on left leg, x4 days, Tried Benadryl and Ibuprofen, Patient reports swelling and redness    HPI   Sherry Mccoy is seen with concern for possible insect bite left lower lateral leg.  She is basically reestablishing care.  Last seen here 2019.    First noted Saturday.  She had some jeans on and was outdoors and felt a sharp stinging type sensation.  This was followed by some swelling and itching of most of the lower leg.  She has taken some Benadryl at night without much relief.  Pruritus has been intense at times.  No fever.  No chills.  Symptoms gradually improving some still have significant pruritus.  No pustules.  She did not see any obvious bee, insect, or spider at the time of her bite  Past Medical History:  Diagnosis Date   Arthritis    jaw   Depression    Past Surgical History:  Procedure Laterality Date   abd pain  2011    reports that she has never smoked. She has never used smokeless tobacco. No history on file for alcohol use and drug use. family history includes Alcohol abuse in her mother; Cancer in her maternal grandmother; Diabetes in her paternal grandfather. No Known Allergies  Review of Systems  Constitutional:  Negative for chills and fever.      Objective:     BP 100/70 (BP Location: Left Arm, Patient Position: Sitting, Cuff Size: Normal)   Pulse 95   Temp 98.3 F (36.8 C) (Oral)   Ht 5' 1.25" (1.556 m)   Wt 96 lb 11.2 oz (43.9 kg)   SpO2 99%   BMI 18.12 kg/m  BP Readings from Last 3 Encounters:  05/22/23 100/70  05/28/18 (!) 88/62  08/02/16 108/62   Wt Readings from Last 3 Encounters:  05/22/23 96 lb 11.2 oz (43.9 kg)  05/28/18 108 lb (49 kg)  08/02/16 111 lb (50.3 kg)      Physical Exam Vitals reviewed.  Constitutional:       General: She is not in acute distress.    Appearance: She is not ill-appearing.  Cardiovascular:     Rate and Rhythm: Normal rate and regular rhythm.  Skin:    Comments: Left lower lateral leg reveals very small approximately 1 mm eschar.  Minimal surrounding edema.  No significant erythema.  No warmth.  Nontender.  No pustules.  Neurological:     Mental Status: She is alert.      No results found for any visits on 05/22/23.    The ASCVD Risk score (Arnett DK, et al., 2019) failed to calculate for the following reasons:   The 2019 ASCVD risk score is only valid for ages 52 to 22    Assessment & Plan:   Probable insect bite left lower lateral leg with local allergic response.  No signs of cellulitis or abscess.  Continue Benadryl at night.  Consider addition of nonsedating daytime antihistamine such as Zyrtec, Allegra, or Xyzal.  Prescription for triamcinolone 0.1% cream written to use twice daily as needed   No follow-ups on file.    Evelena Peat, MD

## 2023-05-29 ENCOUNTER — Ambulatory Visit: Payer: BC Managed Care – PPO | Admitting: Family Medicine

## 2023-05-29 ENCOUNTER — Encounter: Payer: Self-pay | Admitting: Family Medicine

## 2023-05-29 VITALS — BP 96/66 | HR 70 | Temp 98.4°F | Ht 61.25 in | Wt 99.4 lb

## 2023-05-29 DIAGNOSIS — R21 Rash and other nonspecific skin eruption: Secondary | ICD-10-CM | POA: Diagnosis not present

## 2023-05-29 MED ORDER — METHYLPREDNISOLONE ACETATE 80 MG/ML IJ SUSP
80.0000 mg | Freq: Once | INTRAMUSCULAR | Status: AC
Start: 2023-05-29 — End: 2023-05-29
  Administered 2023-05-29: 80 mg via INTRAMUSCULAR

## 2023-05-29 NOTE — Progress Notes (Signed)
   Established Patient Office Visit  Subjective   Patient ID: Sherry Mccoy, female    DOB: [birth year]  Age: 37 y.o. MRN: [id number]  Chief Complaint  Patient presents with   Rash    Patient complains of rash on torso and back, x2 days    HPI   Sherry Mccoy is seen with about 2-day history of extremely pruritic rash on her torso front and back.  She was seen recently with couple of small bite-like lesions on her legs but this rash seems totally different.  She denies any recent changes soaps or detergents.  No new medications.  No fever.  No respiratory symptoms.  She has tried Benadryl and Claritin without any relief.  Difficulty sleeping secondary to pruritus.  Past Medical History:  Diagnosis Date   Arthritis    jaw   Depression    Past Surgical History:  Procedure Laterality Date   abd pain  2011    reports that she has never smoked. She has never used smokeless tobacco. No history on file for alcohol use and drug use. family history includes Alcohol abuse in her mother; Cancer in her maternal grandmother; Diabetes in her paternal grandfather. No Known Allergies  Review of Systems  Constitutional:  Negative for chills and fever.  HENT:  Negative for congestion.   Respiratory:  Negative for cough.   Skin:  Positive for rash.  Neurological:  Negative for headaches.      Objective:     BP 96/66 (BP Location: Left Arm, Patient Position: Sitting, Cuff Size: Normal)   Pulse 70   Temp 98.4 F (36.9 C) (Oral)   Ht 5' 1.25" (1.556 m)   Wt 99 lb 6.4 oz (45.1 kg)   SpO2 99%   BMI 18.63 kg/m  BP Readings from Last 3 Encounters:  05/29/23 96/66  05/22/23 100/70  05/28/18 (!) 88/62   Wt Readings from Last 3 Encounters:  05/29/23 99 lb 6.4 oz (45.1 kg)  05/22/23 96 lb 11.2 oz (43.9 kg)  05/28/18 108 lb (49 kg)      Physical Exam Vitals reviewed.  Constitutional:      General: She is not in acute distress. Cardiovascular:     Rate and Rhythm: Normal rate and regular  rhythm.  Skin:    Findings: Rash present.     Comments: She has diffuse follicular type rash on her torso anteriorly and posteriorly.  No pustules.  No vesicles.  Nonscaly rash.  Blanches slightly with pressure.  Neurological:     Mental Status: She is alert.      No results found for any visits on 05/29/23.    The ASCVD Risk score (Arnett DK, et al., 2019) failed to calculate for the following reasons:   The 2019 ASCVD risk score is only valid for ages 52 to 57    Assessment & Plan:   Follicular type rash on her torso.  Etiology unclear.  No recent changes soaps or detergent.  No recent fever.  Recommend continue over-the-counter antihistamine.  Given severity of pruritus and symptoms Depo-Medrol 80 mg IM given.  She is aware of potential exacerbating factors such as heat.  Touch base if rash not improving by next week   No follow-ups on file.    Evelena Peat, MD

## 2023-05-29 NOTE — Patient Instructions (Signed)
Continue with the antihistamines day and night  Let me know if rash not improved by next week.

## 2023-06-19 ENCOUNTER — Ambulatory Visit (INDEPENDENT_AMBULATORY_CARE_PROVIDER_SITE_OTHER): Payer: BC Managed Care – PPO | Admitting: Family Medicine

## 2023-06-19 ENCOUNTER — Encounter: Payer: Self-pay | Admitting: Family Medicine

## 2023-06-19 VITALS — BP 110/70 | HR 65 | Temp 98.1°F | Ht 61.02 in | Wt 97.1 lb

## 2023-06-19 DIAGNOSIS — Z1159 Encounter for screening for other viral diseases: Secondary | ICD-10-CM

## 2023-06-19 DIAGNOSIS — Z0000 Encounter for general adult medical examination without abnormal findings: Secondary | ICD-10-CM | POA: Diagnosis not present

## 2023-06-19 LAB — CBC WITH DIFFERENTIAL/PLATELET
Basophils Absolute: 0 10*3/uL (ref 0.0–0.1)
Basophils Relative: 0.4 % (ref 0.0–3.0)
Eosinophils Absolute: 0 10*3/uL (ref 0.0–0.7)
Eosinophils Relative: 0.8 % (ref 0.0–5.0)
HCT: 41.2 % (ref 36.0–46.0)
Hemoglobin: 14.1 g/dL (ref 12.0–15.0)
Lymphocytes Relative: 35.8 % (ref 12.0–46.0)
Lymphs Abs: 1.6 10*3/uL (ref 0.7–4.0)
MCHC: 34.3 g/dL (ref 30.0–36.0)
MCV: 95.9 fL (ref 78.0–100.0)
Monocytes Absolute: 0.4 10*3/uL (ref 0.1–1.0)
Monocytes Relative: 9.6 % (ref 3.0–12.0)
Neutro Abs: 2.5 10*3/uL (ref 1.4–7.7)
Neutrophils Relative %: 53.4 % (ref 43.0–77.0)
Platelets: 231 10*3/uL (ref 150.0–400.0)
RBC: 4.3 Mil/uL (ref 3.87–5.11)
RDW: 12.4 % (ref 11.5–15.5)
WBC: 4.6 10*3/uL (ref 4.0–10.5)

## 2023-06-19 LAB — LIPID PANEL
Cholesterol: 171 mg/dL (ref 0–200)
HDL: 81.1 mg/dL (ref >39.00)
LDL Cholesterol: 71 mg/dL (ref 0–99)
NonHDL: 89.86
Total CHOL/HDL Ratio: 2
Triglycerides: 93 mg/dL (ref 0.0–149.0)
VLDL: 18.6 mg/dL (ref 0.0–40.0)

## 2023-06-19 LAB — BASIC METABOLIC PANEL
BUN: 14 mg/dL (ref 6–23)
CO2: 28 meq/L (ref 19–32)
Calcium: 9.2 mg/dL (ref 8.4–10.5)
Chloride: 101 meq/L (ref 96–112)
Creatinine, Ser: 0.65 mg/dL (ref 0.40–1.20)
GFR: 113.01 mL/min (ref >60.00)
Glucose, Bld: 79 mg/dL (ref 70–99)
Potassium: 4.6 meq/L (ref 3.5–5.1)
Sodium: 136 meq/L (ref 135–145)

## 2023-06-19 LAB — HEPATIC FUNCTION PANEL
ALT: 17 U/L (ref 0–35)
AST: 21 U/L (ref 0–37)
Albumin: 4.6 g/dL (ref 3.5–5.2)
Alkaline Phosphatase: 50 U/L (ref 39–117)
Bilirubin, Direct: 0.2 mg/dL (ref 0.0–0.3)
Total Bilirubin: 1.1 mg/dL (ref 0.2–1.2)
Total Protein: 7.1 g/dL (ref 6.0–8.3)

## 2023-06-19 NOTE — Progress Notes (Signed)
Established Patient Office Visit  Subjective   Patient ID: Sherry Mccoy, female    DOB: [birth year]  Age: 37 y.o. MRN: [id number]  Chief Complaint  Patient presents with   Annual Exam    HPI   Sherry Mccoy is seen for complete physical.   Generally very healthy.  Takes no regular medications.  She sees GYN regularly for Pap smears.  No history of abnormal Pap smear.  She has not had any recent lab work.  Has had flu vaccine earlier this fall.  No history of hepatitis C screening but low risk.  Does have a couple of tattoos but no other specific risk factors.  She is currently not getting a lot of regular exercise but plans to run a marathon next December  Health maintenance reviewed:  Health Maintenance  Topic Date Due   HIV Screening  Never done   Hepatitis C Screening  Never done   Cervical Cancer Screening (HPV/Pap Cotest)  12/16/2018   COVID-19 Vaccine (1 - 2024-25 season) Never done   DTaP/Tdap/Td (3 - Td or Tdap) 08/11/2024   INFLUENZA VACCINE  Completed   HPV VACCINES  Aged Out   -Pap smear is reportedly up-to-date but we do not have record of most recent.  Family history-both parents are alive.  Father has no active medical problems.  Mom has history of DVT but this was taking oral contraception.  She has no biological siblings.  Paternal grandfather has type 2 diabetes.  Paternal grandmother had Alzheimer's disease.  Maternal grandfather history of alcohol abuse.  Maternal grandmother history of breast cancer and Alzheimer's disease.  Social history-divorced.  22-1/2-year-old son.  Non-smoker.  No regular alcohol.  Works for [employer]  Past Medical History:  Diagnosis Date   Arthritis    jaw   Depression    Past Surgical History:  Procedure Laterality Date   abd pain  2011    reports that she has never smoked. She has never used smokeless tobacco. No history on file for alcohol use and drug use. family history includes Alcohol abuse in her mother; Cancer in her  maternal grandmother; Diabetes in her paternal grandfather. No Known Allergies  Review of Systems  Constitutional:  Negative for chills, fever, malaise/fatigue and weight loss.  HENT:  Negative for hearing loss.   Eyes:  Negative for blurred vision and double vision.  Respiratory:  Negative for cough and shortness of breath.   Cardiovascular:  Negative for chest pain, palpitations and leg swelling.  Gastrointestinal:  Negative for abdominal pain, blood in stool, constipation and diarrhea.  Genitourinary:  Negative for dysuria.  Skin:  Negative for rash.  Neurological:  Negative for dizziness, speech change, seizures, loss of consciousness and headaches.  Psychiatric/Behavioral:  Negative for depression.       Objective:     BP 110/70 (BP Location: Left Arm, Patient Position: Sitting, Cuff Size: Normal)   Pulse 65   Temp 98.1 F (36.7 C) (Oral)   Ht 5' 1.02" (1.55 m)   Wt 97 lb 1.6 oz (44 kg)   SpO2 99%   BMI 18.33 kg/m  BP Readings from Last 3 Encounters:  06/19/23 110/70  05/29/23 96/66  05/22/23 100/70   Wt Readings from Last 3 Encounters:  06/19/23 97 lb 1.6 oz (44 kg)  05/29/23 99 lb 6.4 oz (45.1 kg)  05/22/23 96 lb 11.2 oz (43.9 kg)      Physical Exam Vitals reviewed.  Constitutional:      General: She  is not in acute distress.    Appearance: She is well-developed. She is not ill-appearing or toxic-appearing.  HENT:     Head: Normocephalic and atraumatic.     Right Ear: Tympanic membrane normal.     Left Ear: Tympanic membrane normal.  Eyes:     Pupils: Pupils are equal, round, and reactive to light.  Neck:     Thyroid: No thyromegaly.  Cardiovascular:     Rate and Rhythm: Normal rate and regular rhythm.     Heart sounds: Normal heart sounds. No murmur heard. Pulmonary:     Effort: No respiratory distress.     Breath sounds: Normal breath sounds. No wheezing or rales.  Abdominal:     General: Bowel sounds are normal. There is no distension.      Palpations: Abdomen is soft. There is no mass.     Tenderness: There is no abdominal tenderness. There is no guarding or rebound.  Musculoskeletal:     Cervical back: Normal range of motion and neck supple.     Right lower leg: No edema.     Left lower leg: No edema.  Lymphadenopathy:     Cervical: No cervical adenopathy.  Skin:    Findings: No rash.  Neurological:     Mental Status: She is alert and oriented to person, place, and time.     Cranial Nerves: No cranial nerve deficit.  Psychiatric:        Behavior: Behavior normal.        Thought Content: Thought content normal.        Judgment: Judgment normal.      No results found for any visits on 06/19/23.    The ASCVD Risk score (Arnett DK, et al., 2019) failed to calculate for the following reasons:   The 2019 ASCVD risk score is only valid for ages 76 to 64    Assessment & Plan:   Problem List Items Addressed This Visit   None Visit Diagnoses       Physical exam    -  Primary   Relevant Orders   Basic metabolic panel   Lipid panel   CBC with Differential/Platelet   Hepatic function panel     Encounter for hepatitis C screening test for low risk patient       Relevant Orders   Hep C Antibody     Healthy 37 year old female.  No chronic or active medical problems.  She sees GYN for Pap smears.  We discussed the following health maintenance items  -Check hepatitis C antibody -Flu vaccine already given -Tetanus up-to-date -No indications for colonoscopy prior to age 79 -Obtain other screening labs as above -Try to establish more consistent regular exercise.  She plans to start marathon training during the next year for [employer] marathon of December 2025 -Discussed sun protection with her history of freckling of the skin and somewhat high risk for skin cancer.  No follow-ups on file.    Evelena Peat, MD

## 2023-06-20 LAB — HEPATITIS C ANTIBODY: Hepatitis C Ab: NONREACTIVE

## 2023-08-30 ENCOUNTER — Ambulatory Visit: Payer: BC Managed Care – PPO | Admitting: Family Medicine
